# Patient Record
Sex: Female | Born: 1965
Health system: Southern US, Community
[De-identification: ages and names within clinical notes are randomized; demographics above are authoritative.]

## PROBLEM LIST (undated history)

## (undated) DIAGNOSIS — G4730 Sleep apnea, unspecified: Secondary | ICD-10-CM

## (undated) DIAGNOSIS — R519 Headache, unspecified: Secondary | ICD-10-CM

## (undated) DIAGNOSIS — M1990 Unspecified osteoarthritis, unspecified site: Secondary | ICD-10-CM

## (undated) DIAGNOSIS — K219 Gastro-esophageal reflux disease without esophagitis: Secondary | ICD-10-CM

## (undated) DIAGNOSIS — R51 Headache: Secondary | ICD-10-CM

## (undated) HISTORY — PX: UPPER GASTROINTESTINAL ENDOSCOPY: SHX188

## (undated) HISTORY — DX: Gastro-esophageal reflux disease without esophagitis: K21.9

## (undated) HISTORY — DX: Unspecified osteoarthritis, unspecified site: M19.90

## (undated) HISTORY — DX: Headache: R51

## (undated) HISTORY — DX: Headache, unspecified: R51.9

## (undated) HISTORY — DX: Sleep apnea, unspecified: G47.30

---

## 1996-05-06 HISTORY — PX: TONSILLECTOMY AND ADENOIDECTOMY: SUR1326

## 1997-06-28 ENCOUNTER — Inpatient Hospital Stay (HOSPITAL_COMMUNITY): Admission: AD | Admit: 1997-06-28 | Discharge: 1997-07-01 | Payer: Self-pay | Admitting: Obstetrics and Gynecology

## 1998-12-05 ENCOUNTER — Other Ambulatory Visit: Admission: RE | Admit: 1998-12-05 | Discharge: 1998-12-05 | Payer: Self-pay | Admitting: Obstetrics and Gynecology

## 2000-02-18 ENCOUNTER — Other Ambulatory Visit: Admission: RE | Admit: 2000-02-18 | Discharge: 2000-02-18 | Payer: Self-pay | Admitting: Obstetrics & Gynecology

## 2001-03-19 ENCOUNTER — Other Ambulatory Visit: Admission: RE | Admit: 2001-03-19 | Discharge: 2001-03-19 | Payer: Self-pay | Admitting: Obstetrics and Gynecology

## 2001-11-13 ENCOUNTER — Inpatient Hospital Stay (HOSPITAL_COMMUNITY): Admission: AD | Admit: 2001-11-13 | Discharge: 2001-11-14 | Payer: Self-pay | Admitting: Obstetrics and Gynecology

## 2002-05-06 HISTORY — PX: VAGINAL HYSTERECTOMY: SUR661

## 2002-12-23 ENCOUNTER — Other Ambulatory Visit: Admission: RE | Admit: 2002-12-23 | Discharge: 2002-12-23 | Payer: Self-pay | Admitting: Obstetrics and Gynecology

## 2003-05-09 ENCOUNTER — Other Ambulatory Visit: Admission: RE | Admit: 2003-05-09 | Discharge: 2003-05-09 | Payer: Self-pay | Admitting: Obstetrics and Gynecology

## 2004-07-24 ENCOUNTER — Other Ambulatory Visit: Admission: RE | Admit: 2004-07-24 | Discharge: 2004-07-24 | Payer: Self-pay | Admitting: Obstetrics and Gynecology

## 2004-11-13 ENCOUNTER — Inpatient Hospital Stay (HOSPITAL_COMMUNITY): Admission: RE | Admit: 2004-11-13 | Discharge: 2004-11-15 | Payer: Self-pay | Admitting: Obstetrics and Gynecology

## 2013-11-01 DIAGNOSIS — R079 Chest pain, unspecified: Secondary | ICD-10-CM | POA: Insufficient documentation

## 2013-11-08 ENCOUNTER — Other Ambulatory Visit (HOSPITAL_COMMUNITY): Payer: Self-pay | Admitting: Physician Assistant

## 2013-11-08 DIAGNOSIS — R079 Chest pain, unspecified: Secondary | ICD-10-CM

## 2013-11-09 ENCOUNTER — Ambulatory Visit (HOSPITAL_COMMUNITY)
Admission: RE | Admit: 2013-11-09 | Discharge: 2013-11-09 | Disposition: A | Discharge status: Home / Self Care | Payer: BC Managed Care – PPO | Source: Ambulatory Visit | Attending: Internal Medicine | Admitting: Internal Medicine

## 2013-11-09 DIAGNOSIS — R079 Chest pain, unspecified: Secondary | ICD-10-CM | POA: Insufficient documentation

## 2013-11-09 NOTE — Procedures (Signed)
Exercise Treadmill Test Test  Exercise Tolerance Test Ordering MD: Maurice SmallElaine Griffin  I   Unique Test No: 1   Treadmill:  1  Indication for ETT: chest pain - rule out ischemia  Contraindication to ETT: No   Stress Modality: exercise - treadmill  Cardiac Imaging Performed: non   Protocol: standard Bruce - maximal  Max BP:  203/61  Max MPHR (bpm):  173 85% MPR (bpm):  147  MPHR obtained (bpm):  164 % MPHR obtained:  95  Reached 85% MPHR (min:sec):  4:11 Total Exercise Time (min-sec):  7  Workload in METS:  8.5 Borg Scale: 15  Reason ETT Terminated:  dyspnea    ST Segment Analysis At Rest: normal ST segments - no evidence of significant ST depression With Exercise: no evidence of significant ST depression  Other Information Arrhythmia:  No Angina during ETT:  absent (0) Quality of ETT:  non-diagnostic  ETT Interpretation:  borderline (indeterminate) with non-specific ST changes  Comments: Good exercise tolerance Marked hypertensive response to exercise Significant lead artifact (loose leads) at peak stress makes it difficult to exclude ischemic changes Non-diagnostic treadmill stress test, due to lead artifact.  Recommendations: Consider pharmacologic stress test if further concern for ischemic is suspected.  Chrystie NoseKenneth C. Dameon Soltis, MD, [facility] Attending Cardiologist [facility]

## 2013-11-15 ENCOUNTER — Ambulatory Visit (INDEPENDENT_AMBULATORY_CARE_PROVIDER_SITE_OTHER): Payer: BC Managed Care – PPO | Admitting: Internal Medicine

## 2013-11-15 ENCOUNTER — Encounter: Payer: Self-pay | Admitting: Internal Medicine

## 2013-11-15 VITALS — BP 129/76 | HR 67 | Ht 63.0 in | Wt 247.0 lb

## 2013-11-15 DIAGNOSIS — R0789 Other chest pain: Secondary | ICD-10-CM

## 2013-11-15 DIAGNOSIS — R0602 Shortness of breath: Secondary | ICD-10-CM

## 2013-11-15 NOTE — Progress Notes (Addendum)
HPI Patinet says over past 5 to 6 wks.  Works at [employer].  Some days can walk up stairs without problem  SOmetimes  Cant walk up without sB Pain front   Goes to back sometimes.  In back feels like burning. Pain can occur with and without acitvity  Not assocaited with food Cleaned house last weekend  No problem  Felt bad after SUn in church sitting  Started  Whole day didn't feel good Pain different than heart burn Tired  Exhausted    Chol up a little   No Known Allergies  Current Outpatient Prescriptions  Medication Sig Dispense Refill  . Ibuprofen (ADVIL PO) Take by mouth as needed.       No current facility-administered medications for this visit.      PSH:  TAH  Bladder tach  Rectal prolaps repair  T & A   FHx:  Dad CABG 7358  Mom CHF  Diabetic  Moms broth with CAD.  One sis no prblesm   History   Social History  . Marital Status: Married    Spouse Name: N/A    Number of Children: N/A  . Years of Education: N/A   Occupational History  . Not on file.   Social History Main Topics  . Smoking status: Passive Smoke Exposure - Never Smoker  . Smokeless tobacco: Not on file  . Alcohol Use: No  . Drug Use: No  . Sexual Activity: Not on file   Other Topics Concern  . Not on file   Social History Narrative  . No narrative on file   Husband smokes   Review of Systems:  All systems reviewed.  They are negative to the above problem except as previously stated.  Vital Signs: BP 129/76  Pulse 67  Ht 5\' 3"  (1.6 m)  Wt 247 lb (112.038 kg)  BMI 43.76 kg/m2  Physical Exam Patinet is in NAD HEENT:  Normocephalic, atraumatic. EOMI, PERRLA.  Neck: JVP is normal.  No bruits.  Lungs: clear to auscultation. No rales no wheezes.  Heart: Regular rate and rhythm. Normal S1, S2. No S3.   No significant murmurs. PMI not displaced.  Abdomen:  Supple, nontender. Normal bowel sounds. No masses. No hepatomegaly.  Extremities:   Good distal pulses throughout. No lower  extremity edema.  Musculoskeletal :moving all extremities.  Neuro:   alert and oriented x3.  CN II-XII grossly intact.  EKG  SR 67   Assessment and Plan:  1.  SOB  Patient's are somewhat atypical  I would set up for echo to evaluate systolic and diastolic function.   Continue activies as tolerated.    2.  HCM  Will set up for fasting labs esp given family history of CAD

## 2013-11-15 NOTE — Patient Instructions (Signed)
Your physician has requested that you have an echocardiogram. Echocardiography is a painless test that uses sound waves to create images of your heart. It provides your doctor with information about the size and shape of your heart and how well your heart's chambers and valves are working. This procedure takes approximately one hour. There are no restrictions for this procedure.  WHEN YOU RETURN FOR ECHO WILL HAVE YOU GET FASTING LABS (LP/TSH/CBC)

## 2013-11-25 ENCOUNTER — Other Ambulatory Visit (INDEPENDENT_AMBULATORY_CARE_PROVIDER_SITE_OTHER): Payer: BC Managed Care – PPO

## 2013-11-25 ENCOUNTER — Ambulatory Visit (HOSPITAL_COMMUNITY): Payer: BC Managed Care – PPO | Attending: Cardiology | Admitting: Radiology

## 2013-11-25 ENCOUNTER — Other Ambulatory Visit (HOSPITAL_COMMUNITY): Payer: BC Managed Care – PPO | Admitting: Cardiology

## 2013-11-25 DIAGNOSIS — I517 Cardiomegaly: Secondary | ICD-10-CM | POA: Insufficient documentation

## 2013-11-25 DIAGNOSIS — R0789 Other chest pain: Secondary | ICD-10-CM

## 2013-11-25 DIAGNOSIS — R079 Chest pain, unspecified: Secondary | ICD-10-CM

## 2013-11-25 DIAGNOSIS — R0602 Shortness of breath: Secondary | ICD-10-CM

## 2013-11-25 DIAGNOSIS — E669 Obesity, unspecified: Secondary | ICD-10-CM | POA: Insufficient documentation

## 2013-11-25 DIAGNOSIS — Z8249 Family history of ischemic heart disease and other diseases of the circulatory system: Secondary | ICD-10-CM | POA: Insufficient documentation

## 2013-11-25 DIAGNOSIS — I059 Rheumatic mitral valve disease, unspecified: Secondary | ICD-10-CM | POA: Insufficient documentation

## 2013-11-25 LAB — CBC WITH DIFFERENTIAL/PLATELET
BASOS ABS: 0 10*3/uL (ref 0.0–0.1)
Basophils Relative: 0.3 % (ref 0.0–3.0)
EOS ABS: 0.1 10*3/uL (ref 0.0–0.7)
Eosinophils Relative: 1.3 % (ref 0.0–5.0)
HEMATOCRIT: 39.1 % (ref 36.0–46.0)
Hemoglobin: 13.1 g/dL (ref 12.0–15.0)
Lymphocytes Relative: 26.2 % (ref 12.0–46.0)
Lymphs Abs: 2 10*3/uL (ref 0.7–4.0)
MCHC: 33.6 g/dL (ref 30.0–36.0)
MCV: 86.4 fl (ref 78.0–100.0)
MONO ABS: 0.4 10*3/uL (ref 0.1–1.0)
Monocytes Relative: 5.5 % (ref 3.0–12.0)
NEUTROS ABS: 5.2 10*3/uL (ref 1.4–7.7)
Neutrophils Relative %: 66.7 % (ref 43.0–77.0)
Platelets: 233 10*3/uL (ref 150.0–400.0)
RBC: 4.52 Mil/uL (ref 3.87–5.11)
RDW: 12.8 % (ref 11.5–15.5)
WBC: 7.8 10*3/uL (ref 4.0–10.5)

## 2013-11-25 LAB — LIPID PANEL
CHOLESTEROL: 175 mg/dL (ref 0–200)
HDL: 38.4 mg/dL — AB (ref >39.00)
LDL Cholesterol: 112 mg/dL — ABNORMAL HIGH (ref 0–99)
NonHDL: 136.6
Total CHOL/HDL Ratio: 5
Triglycerides: 121 mg/dL (ref 0.0–149.0)
VLDL: 24.2 mg/dL (ref 0.0–40.0)

## 2013-11-25 LAB — TSH: TSH: 1.24 u[IU]/mL (ref 0.35–4.50)

## 2013-11-25 MED ORDER — PERFLUTREN PROTEIN A MICROSPH IV SUSP
3.0000 mL | Freq: Once | INTRAVENOUS | Status: AC
  Administered 2013-11-25: 3 mL via INTRAVENOUS

## 2013-11-25 NOTE — Progress Notes (Signed)
Echocardiogram performed with [employer].  

## 2013-11-28 ENCOUNTER — Telehealth: Payer: Self-pay | Admitting: Internal Medicine

## 2013-12-07 NOTE — Telephone Encounter (Signed)
error 

## 2013-12-28 ENCOUNTER — Institutional Professional Consult (permissible substitution): Payer: BC Managed Care – PPO | Admitting: Cardiology

## 2014-08-08 ENCOUNTER — Ambulatory Visit: Payer: Self-pay | Admitting: Gastroenterology

## 2014-09-27 ENCOUNTER — Ambulatory Visit (INDEPENDENT_AMBULATORY_CARE_PROVIDER_SITE_OTHER): Payer: 59 | Admitting: Gastroenterology

## 2014-09-27 ENCOUNTER — Encounter: Payer: Self-pay | Admitting: Gastroenterology

## 2014-09-27 ENCOUNTER — Other Ambulatory Visit (INDEPENDENT_AMBULATORY_CARE_PROVIDER_SITE_OTHER): Payer: 59

## 2014-09-27 VITALS — BP 136/84 | HR 92 | Ht 62.75 in | Wt 244.5 lb

## 2014-09-27 DIAGNOSIS — R1011 Right upper quadrant pain: Secondary | ICD-10-CM | POA: Diagnosis not present

## 2014-09-27 DIAGNOSIS — K219 Gastro-esophageal reflux disease without esophagitis: Secondary | ICD-10-CM

## 2014-09-27 DIAGNOSIS — R079 Chest pain, unspecified: Secondary | ICD-10-CM

## 2014-09-27 LAB — BASIC METABOLIC PANEL
BUN: 11 mg/dL (ref 6–23)
CHLORIDE: 104 meq/L (ref 96–112)
CO2: 29 meq/L (ref 19–32)
CREATININE: 0.71 mg/dL (ref 0.40–1.20)
Calcium: 9.2 mg/dL (ref 8.4–10.5)
GFR: 93.33 mL/min (ref >60.00)
GLUCOSE: 99 mg/dL (ref 70–99)
POTASSIUM: 3.9 meq/L (ref 3.5–5.1)
SODIUM: 138 meq/L (ref 135–145)

## 2014-09-27 LAB — CBC WITH DIFFERENTIAL/PLATELET
BASOS PCT: 0.4 % (ref 0.0–3.0)
Basophils Absolute: 0 10*3/uL (ref 0.0–0.1)
EOS ABS: 0.1 10*3/uL (ref 0.0–0.7)
Eosinophils Relative: 1.7 % (ref 0.0–5.0)
HCT: 38.4 % (ref 36.0–46.0)
HEMOGLOBIN: 12.9 g/dL (ref 12.0–15.0)
Lymphocytes Relative: 29.1 % (ref 12.0–46.0)
Lymphs Abs: 2.1 10*3/uL (ref 0.7–4.0)
MCHC: 33.6 g/dL (ref 30.0–36.0)
MCV: 86 fl (ref 78.0–100.0)
MONO ABS: 0.5 10*3/uL (ref 0.1–1.0)
Monocytes Relative: 6.6 % (ref 3.0–12.0)
NEUTROS PCT: 62.2 % (ref 43.0–77.0)
Neutro Abs: 4.6 10*3/uL (ref 1.4–7.7)
Platelets: 244 10*3/uL (ref 150.0–400.0)
RBC: 4.47 Mil/uL (ref 3.87–5.11)
RDW: 13.6 % (ref 11.5–15.5)
WBC: 7.4 10*3/uL (ref 4.0–10.5)

## 2014-09-27 LAB — HEPATIC FUNCTION PANEL
ALBUMIN: 4.2 g/dL (ref 3.5–5.2)
ALK PHOS: 76 U/L (ref 39–117)
ALT: 15 U/L (ref 0–35)
AST: 12 U/L (ref 0–37)
BILIRUBIN DIRECT: 0.1 mg/dL (ref 0.0–0.3)
BILIRUBIN TOTAL: 0.6 mg/dL (ref 0.2–1.2)
TOTAL PROTEIN: 7.3 g/dL (ref 6.0–8.3)

## 2014-09-27 LAB — TSH: TSH: 1.48 u[IU]/mL (ref 0.35–4.50)

## 2014-09-27 MED ORDER — OMEPRAZOLE 40 MG PO CPDR: 40.0000 mg | DELAYED_RELEASE_CAPSULE | Freq: Every day | ORAL | Status: DC

## 2014-09-27 NOTE — Progress Notes (Signed)
    History of Present Illness: This is a 49 year old female presenting for the evaluation of intermittent right upper quadrant pain, right lower chest pain, right back pain and mid substernal pain. She underwent evaluation by cardiology one year ago for these symptoms. Echocardiogram and treadmill test were negative. She states she takes over-the-counter Prilosec on occasion with some help and her symptoms. Her symptoms are episodic and occasionally brought on by meals. She notes occasionally her stools change from Beckers to yellow on occasion but the frequency of stools has not changed. Denies weight loss, constipation, diarrhea, change in stool caliber, melena, hematochezia, nausea, vomiting, dysphagia.  Review of Systems: Pertinent positive and negative review of systems were noted in the above HPI section. All other review of systems were otherwise negative.  Current Medications, Allergies, Past Medical History, Past Surgical History, Family History and Social History were reviewed in [facility] electronic [facility] record.  Physical Exam: General: Well developed, well nourished, obese, no acute distress Head: Normocephalic and atraumatic Eyes:  sclerae anicteric, EOMI Ears: Normal auditory acuity Mouth: No deformity or lesions Neck: Supple, no masses or thyromegaly Lungs: Clear throughout to auscultation, no chest wall tenderness Heart: Regular rate and rhythm; no murmurs, rubs or bruits Abdomen: Soft, non tender and non distended. No masses, hepatosplenomegaly or hernias noted. Normal Bowel sounds Musculoskeletal: Symmetrical with no gross deformities  Skin: No lesions on visible extremities Pulses:  Normal pulses noted Extremities: No clubbing, cyanosis, edema or deformities noted Neurological: Alert oriented x 4, grossly nonfocal Cervical Nodes:  No significant cervical adenopathy Inguinal Nodes: No significant inguinal adenopathy Psychological:  Alert and cooperative. Normal mood  and affect  Assessment and Recommendations:  1. Right upper quadrant pain, right chest pain, right back pain, midsternal chest pain. Rule out GERD, ulcer, gastritis, cholelithiasis, musculoskeletal etc. Begin pantoprazole 40 mg daily and standard antireflux measures. Obtain standard blood tests, schedule abdominal ultrasound and schedule upper endoscopy. The risks (including bleeding, perforation, infection, missed lesions, medication reactions and possible hospitalization or surgery if complications occur), benefits, and alternatives to endoscopy with possible biopsy and possible dilation were discussed with the patient and they consent to proceed.   2. Colorectal cancer screening, average risk. Recommend colonoscopy at age 49.

## 2014-09-27 NOTE — Patient Instructions (Addendum)
Your physician has requested that you go to the basement for the following lab work before leaving today:[facility] panel.  We have sent the following medications to your pharmacy for you to pick up at your convenience:omeprazole.  Patient advised to avoid spicy, acidic, citrus, chocolate, mints, fruit and fruit juices.  Limit the intake of caffeine, alcohol and Soda.  Don't exercise too soon after eating.  Don't lie down within 3-4 hours of eating.  Elevate the head of your bed.  You have been scheduled for an endoscopy. Please follow written instructions given to you at your visit today. If you use inhalers (even only as needed), please bring them with you on the day of your procedure. Your physician has requested that you go to www.startemmi.com and enter the access code given to you at your visit today. This web site gives a general overview about your procedure. However, you should still follow specific instructions given to you by our office regarding your preparation for the procedure.  You have been scheduled [facility] an abdominal [facility] (1st floor of hospital) on 09/28/14 at 8:30am. Please arrive 15 minutes prior to your appointment for registration. Make certain not to have anything to eat or drink 6 hours prior to your appointment. Should you need to reschedule your appointment, please contact radiology at [phone]. This test typically takes about 30 minutes to perform.  Thank you for choosing me and [facility].  Venita LickMalcolm T. Pleas KochStark, Jr., MD., Clementeen GrahamFACG  cc: Maurice SmallElaine Griffin, MD  .gib

## 2014-09-28 ENCOUNTER — Ambulatory Visit (HOSPITAL_COMMUNITY)
Admission: RE | Admit: 2014-09-28 | Discharge: 2014-09-28 | Disposition: A | Discharge status: Home / Self Care | Payer: 59 | Source: Ambulatory Visit | Attending: Gastroenterology | Admitting: Gastroenterology

## 2014-09-28 DIAGNOSIS — R079 Chest pain, unspecified: Secondary | ICD-10-CM

## 2014-09-28 DIAGNOSIS — R1011 Right upper quadrant pain: Secondary | ICD-10-CM | POA: Diagnosis not present

## 2014-09-28 DIAGNOSIS — K824 Cholesterolosis of gallbladder: Secondary | ICD-10-CM | POA: Diagnosis not present

## 2014-10-04 ENCOUNTER — Ambulatory Visit (AMBULATORY_SURGERY_CENTER): Payer: 59 | Admitting: Gastroenterology

## 2014-10-04 ENCOUNTER — Encounter: Payer: Self-pay | Admitting: Gastroenterology

## 2014-10-04 VITALS — BP 141/84 | HR 75 | Temp 97.5°F | Resp 16 | Ht 62.75 in | Wt 244.0 lb

## 2014-10-04 DIAGNOSIS — R1011 Right upper quadrant pain: Secondary | ICD-10-CM | POA: Diagnosis not present

## 2014-10-04 DIAGNOSIS — R0789 Other chest pain: Secondary | ICD-10-CM | POA: Diagnosis not present

## 2014-10-04 DIAGNOSIS — K2210 Ulcer of esophagus without bleeding: Secondary | ICD-10-CM

## 2014-10-04 DIAGNOSIS — K219 Gastro-esophageal reflux disease without esophagitis: Secondary | ICD-10-CM | POA: Diagnosis not present

## 2014-10-04 DIAGNOSIS — K2950 Unspecified chronic gastritis without bleeding: Secondary | ICD-10-CM | POA: Diagnosis not present

## 2014-10-04 MED ORDER — SODIUM CHLORIDE 0.9 % IV SOLN: 500.0000 mL | INTRAVENOUS | Status: DC

## 2014-10-04 NOTE — Progress Notes (Signed)
Called to room to assist during endoscopic procedure.  Patient ID and intended procedure confirmed with present staff. Received instructions for my participation in the procedure from the performing physician.  

## 2014-10-04 NOTE — Progress Notes (Signed)
Report to PACU, RN, vss, BBS= Clear.  

## 2014-10-04 NOTE — Patient Instructions (Signed)
YOU HAD AN ENDOSCOPIC PROCEDURE TODAY AT THE [facility]:   Refer to the procedure report that was given to you for any specific questions about what was found during the examination.  If the procedure report does not answer your questions, please call your gastroenterologist to clarify.  If you requested that your care partner not be given the details of your procedure findings, then the procedure report has been included in a sealed envelope for you to review at your convenience later.  YOU SHOULD EXPECT: Some feelings of bloating in the abdomen. Passage of more gas than usual.  Walking can help get rid of the air that was put into your GI tract during the procedure and reduce the bloating. If you had a lower endoscopy (such as a colonoscopy or flexible sigmoidoscopy) you may notice spotting of blood in your stool or on the toilet paper. If you underwent a bowel prep for your procedure, you may not have a normal bowel movement for a few days.  Please Note:  You might notice some irritation and congestion in your nose or some drainage.  This is from the oxygen used during your procedure.  There is no need for concern and it should clear up in a day or so.  SYMPTOMS TO REPORT IMMEDIATELY:    Following upper endoscopy (EGD)  Vomiting of blood or coffee ground material  New chest pain or pain under the shoulder blades  Painful or persistently difficult swallowing  New shortness of breath  Fever of 100F or higher  Black, tarry-looking stools  For urgent or emergent issues, a gastroenterologist can be reached at any hour by calling [phone].   DIET: Your first meal following the procedure should be a small meal and then it is ok to progress to your normal diet. Heavy or fried foods are harder to digest and may make you feel nauseous or bloated.  Likewise, meals heavy in dairy and vegetables can increase bloating.  Drink plenty of fluids but you should avoid alcoholic beverages  for 24 hours.  ACTIVITY:  You should plan to take it easy for the rest of today and you should NOT DRIVE or use heavy machinery until tomorrow (because of the sedation medicines used during the test).    FOLLOW UP: Our staff will call the number listed on your records the next business day following your procedure to check on you and address any questions or concerns that you may have regarding the information given to you following your procedure. If we do not reach you, we will leave a message.  However, if you are feeling well and you are not experiencing any problems, there is no need to return our call.  We will assume that you have returned to your regular daily activities without incident.  If any biopsies were taken you will be contacted by phone or by letter within the next 1-3 weeks.  Please call us at [phone] if you have not heard about the biopsies in 3 weeks.    SIGNATURES/CONFIDENTIALITY: You and/or your care partner have signed paperwork which will be entered into your electronic medical record.  These signatures attest to the fact that that the information above on your After Visit Summary has been reviewed and is understood.  Full responsibility of the confidentiality of this discharge information lies with you and/or your care-partner.  Esophagitis, anti-reflux regimen, and hiatal hernia information given.  Follow-up in office 6-8 weeks.  Continue your meds.

## 2014-10-04 NOTE — Op Note (Signed)
[facility] [address] [place], [id number]   ENDOSCOPY PROCEDURE REPORT  PATIENT: [name], Tracy T  MR#: [id number] BIRTHDATE: April 08, 1966 , 49  yrs. old GENDER: female ENDOSCOPIST: Meryl DareMalcolm T Krisalyn Yankowski, MD, [facility] REFERRED BY:  Maurice SmallElaine Griffin, M.D. PROCEDURE DATE:  10/04/2014 PROCEDURE:  EGD w/ biopsy ASA CLASS:     Class III INDICATIONS:  chest pain, abdominal pain in the upper right quadrant, and history of esophageal reflux. MEDICATIONS: Monitored anesthesia care and Propofol 200 mg IV TOPICAL ANESTHETIC: none DESCRIPTION OF PROCEDURE: After the risks benefits and alternatives of the procedure were thoroughly explained, informed consent was obtained.  The LB [id number] endoscope was introduced through the mouth and advanced to the second portion of the duodenum , Without limitations.  The instrument was slowly withdrawn as the mucosa was fully examined.    ESOPHAGUS: There was LA Class B esophagitis (One or more mucosal breaks > 5mm, but without continuity across mucosal folds) noted. There was short segment erythema found in the distal esophagus, distal to her erosions.  The length was 1-2cm . R/O Barrett's. Multiple biopsies were performed.   The esophagus was otherwise normal. STOMACH: The mucosa and folds of the stomach appeared normal. DUODENUM: The duodenal mucosa showed no abnormalities in the bulb and 2nd part of the duodenum.  Retroflexed views revealed a small hiatal hernia.     The scope was then withdrawn from the patient and the procedure completed.  COMPLICATIONS: There were no immediate complications.  ENDOSCOPIC IMPRESSION: 1.   LA Class B esophagitis 2.   Erythema found in the distal esophagus; multiple biopsies performed 3.   Small hiatal hernia  RECOMMENDATIONS: 1.  Anti-reflux regimen long term 2.  Await pathology results 3.  Continue PPI qam long term 4.  OP follow-up in 6-8 weeks  [R eSigned:  Meryl DareMalcolm T [place] Katich, MD, [facility]  10/04/2014 3:36 PM  [C

## 2014-10-05 ENCOUNTER — Telehealth: Payer: Self-pay | Admitting: *Deleted

## 2014-10-05 NOTE — Telephone Encounter (Signed)
  Follow up Call-  Call back number 10/04/2014  Post procedure Call Back phone  # [phone]  Permission to leave phone message Yes     Patient questions:  Do you have a fever, pain , or abdominal swelling? No. Pain Score  0 *  Have you tolerated food without any problems? Yes.    Have you been able to return to your normal activities? Yes.    Do you have any questions about your discharge instructions: Diet   No. Medications  No. Follow up visit  No.  Do you have questions or concerns about your Care? No.  Actions: * If pain score is 4 or above: No action needed, pain <4.  Patient c/o "sore throat", denies any other problems. Explained she may use OTC products if needed. Encouraged patient to call us back if needed.

## 2014-10-14 ENCOUNTER — Encounter: Payer: Self-pay | Admitting: Gastroenterology

## 2015-01-05 ENCOUNTER — Ambulatory Visit: Payer: 59 | Admitting: Gastroenterology

## 2015-01-26 ENCOUNTER — Encounter: Payer: Self-pay | Admitting: Gastroenterology

## 2015-01-26 ENCOUNTER — Ambulatory Visit (INDEPENDENT_AMBULATORY_CARE_PROVIDER_SITE_OTHER): Payer: Commercial Managed Care - HMO | Admitting: Gastroenterology

## 2015-01-26 VITALS — BP 130/78 | HR 88 | Ht 62.75 in | Wt 249.6 lb

## 2015-01-26 DIAGNOSIS — K2100 Gastro-esophageal reflux disease with esophagitis, without bleeding: Secondary | ICD-10-CM

## 2015-01-26 DIAGNOSIS — R079 Chest pain, unspecified: Secondary | ICD-10-CM

## 2015-01-26 MED ORDER — OMEPRAZOLE 40 MG PO CPDR: 40.0000 mg | DELAYED_RELEASE_CAPSULE | Freq: Two times a day (BID) | ORAL | Status: DC

## 2015-01-26 NOTE — Progress Notes (Signed)
    History of Present Illness: This is a 49 year old female returning for follow-up of GERD. EGD in May 2016 showed LA class B erosive esophagitis and a small hiatal hernia. Abdominal ultrasound showed small gallbladder polyps. Her symptoms are under better control but still has symptoms about every other day. No dysphagia, N/V.   Current Medications, Allergies, Past Medical History, Past Surgical History, Family History and Social History were reviewed in [employer] record.  Physical Exam: General: Well developed, well nourished, obese, no acute distress Head: Normocephalic and atraumatic Eyes:  sclerae anicteric, EOMI Ears: Normal auditory acuity Mouth: No deformity or lesions Lungs: Clear throughout to auscultation Heart: Regular rate and rhythm; no murmurs, rubs or bruits Abdomen: Soft, non tender and non distended. No masses, hepatosplenomegaly or hernias noted. Normal Bowel sounds Musculoskeletal: Symmetrical with no gross deformities  Pulses:  Normal pulses noted Extremities: No clubbing, cyanosis, edema or deformities noted Neurological: Alert oriented x 4, grossly nonfocal Psychological:  Alert and cooperative. Normal mood and affect  Assessment and Recommendations:  1. GERD with LA class B erosive esophagitis. Symptoms not adequately controlled. Increase omeprazole to 40 mg po bid. Weight loss program. Call us with progress report in 2 months.

## 2015-01-26 NOTE — Patient Instructions (Signed)
We have sent the following medications to your pharmacy for you to pick up at your convenience: Omeprazole 40 mg twice a day   Please call the office in 2 months with an update on your symptoms.  Follow up in one year.  Thank you for choosing me and [facility].  Venita Lick. Pleas Koch., MD., Clementeen Graham

## 2016-06-17 ENCOUNTER — Encounter: Payer: Self-pay | Admitting: Gastroenterology

## 2016-07-09 IMAGING — US US ABDOMEN COMPLETE
1 series · 14 of 25 positions shown · non-contrast
Comparison: None.

CLINICAL DATA: Right upper quadrant pain for 1 year.

EXAM:
ULTRASOUND ABDOMEN COMPLETE

[Series 1: us abdomen complete · 0.21mm/px · 14 of 96 slices shown]
[im 1/96]
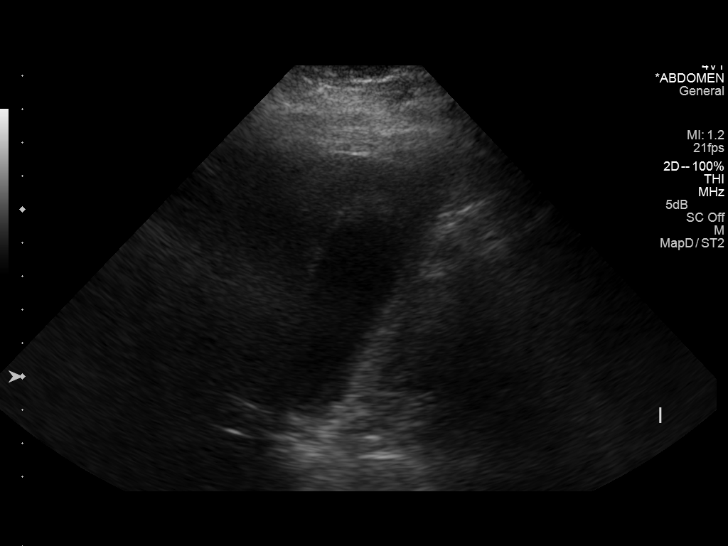
[im 8/96]
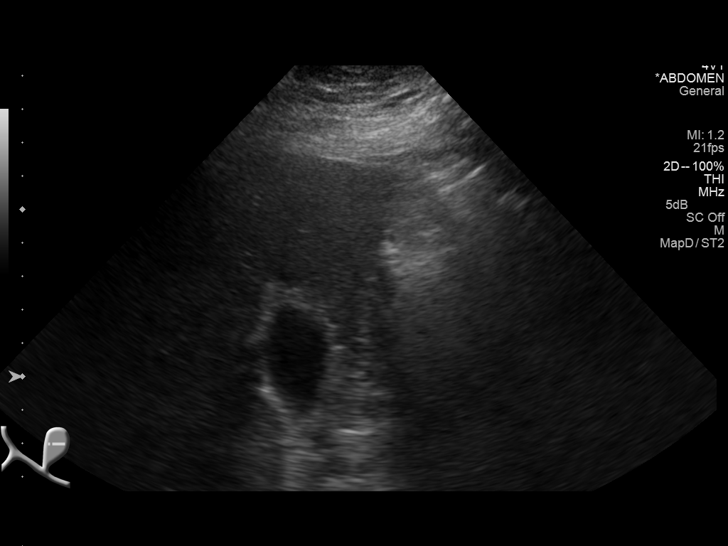
[im 16/96]
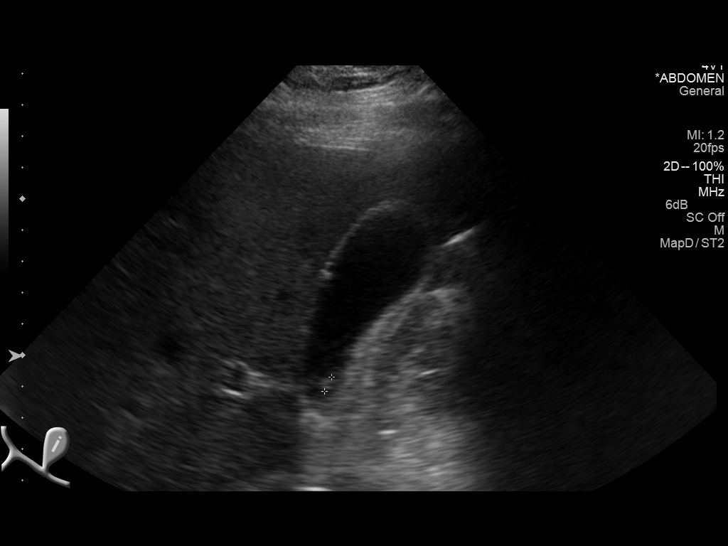
[im 24/96]
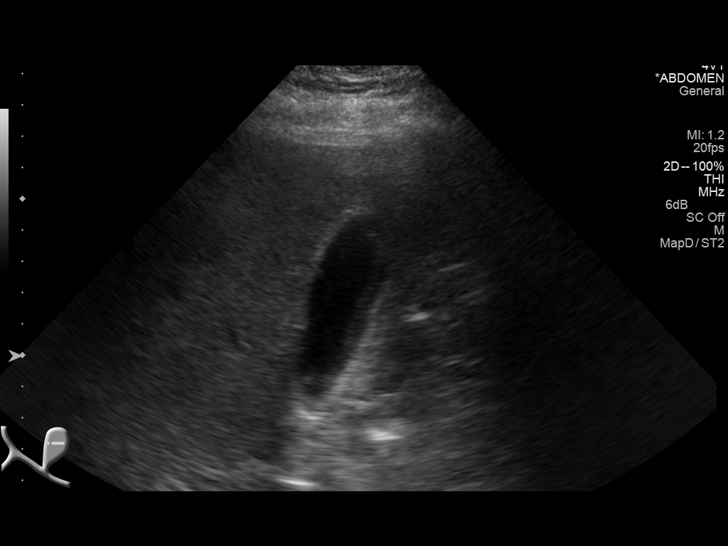
[im 32/96]
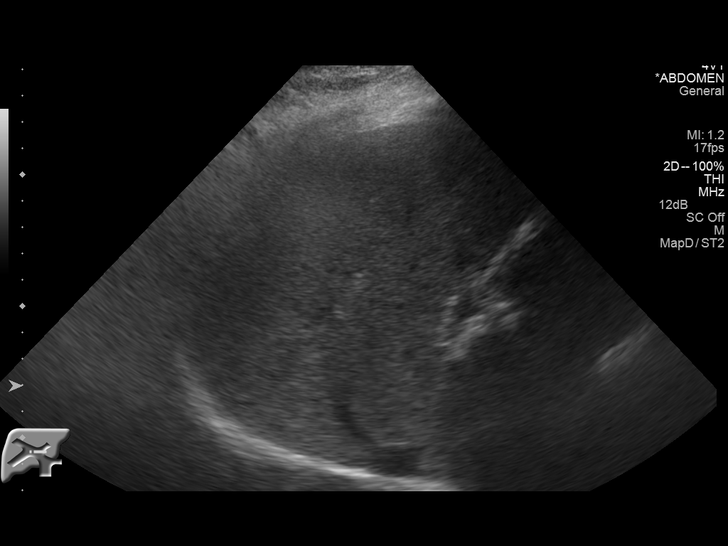
[im 36/96]
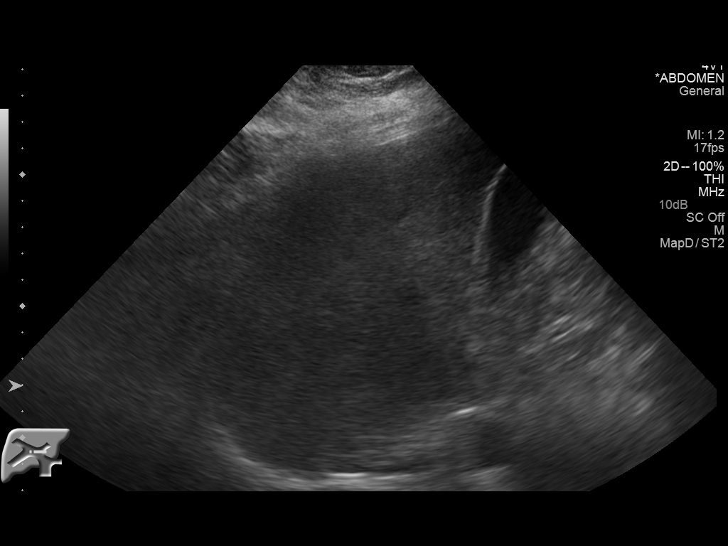
[im 44/96]
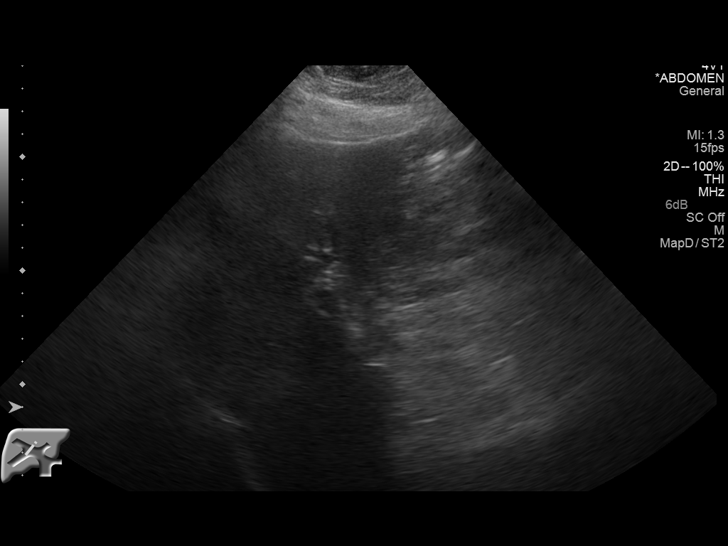
[im 52/96]
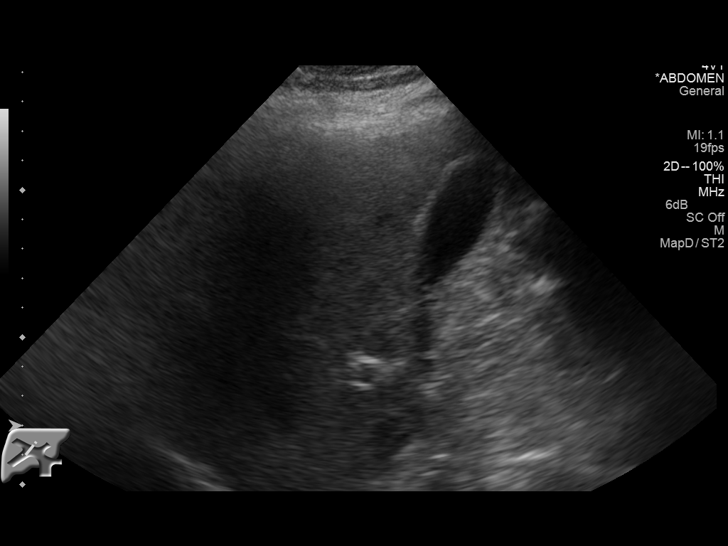
[im 60/96]
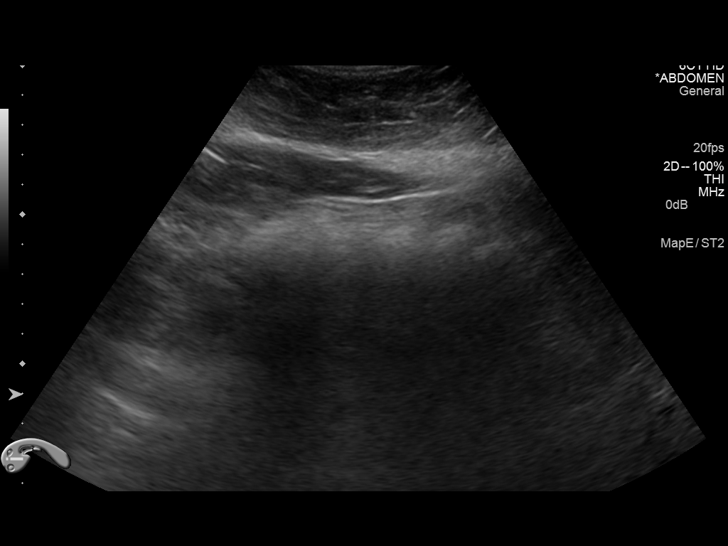
[im 64/96]
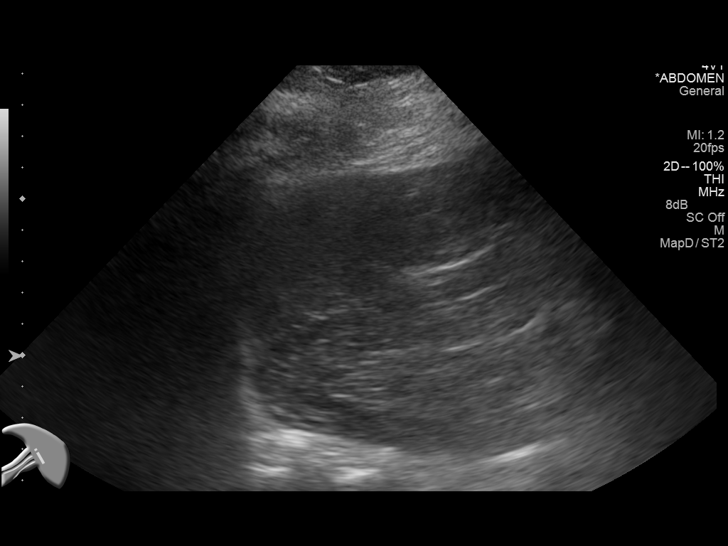
[im 72/96]
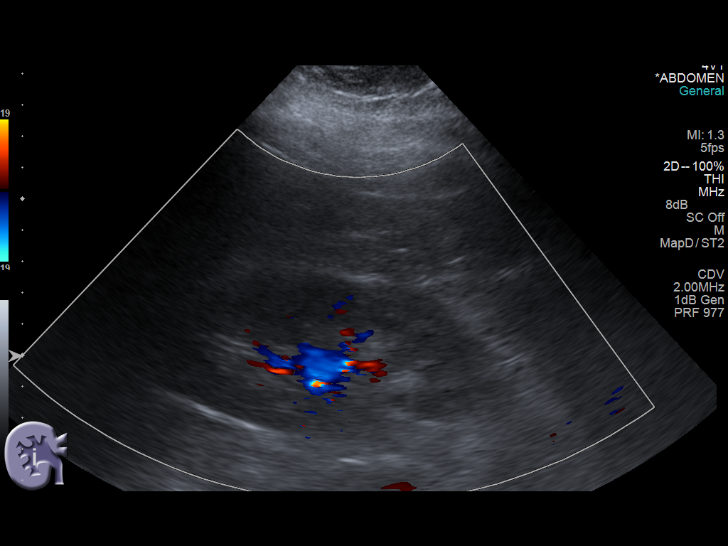
[im 80/96]
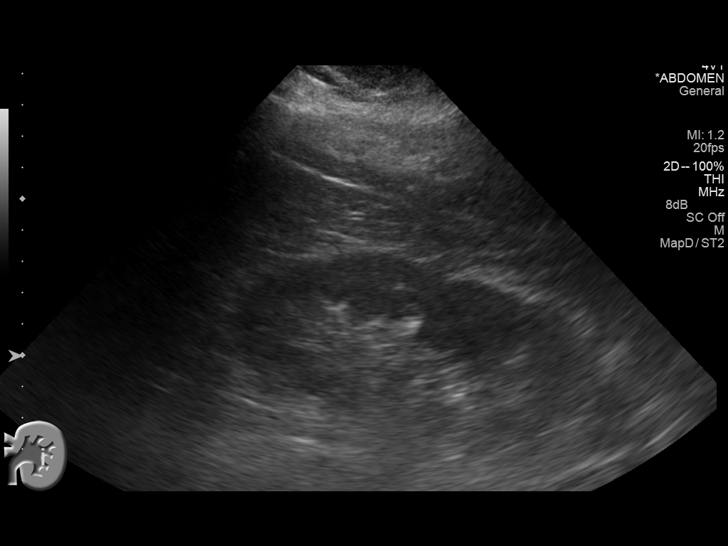
[im 88/96]
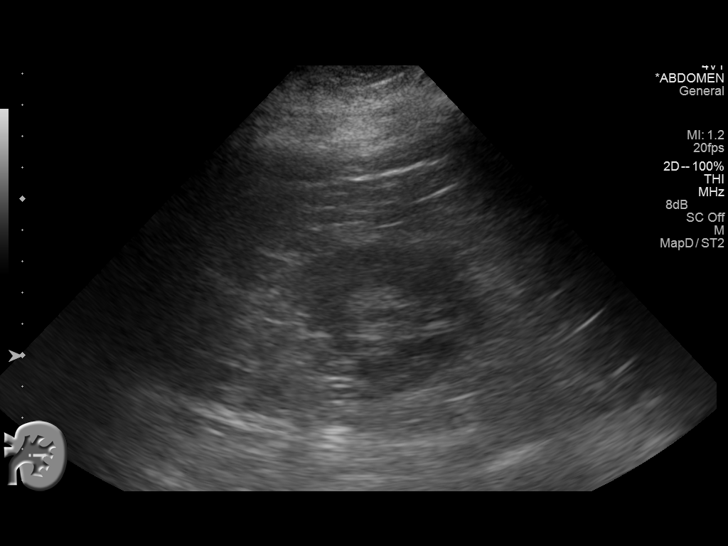
[im 96/96]
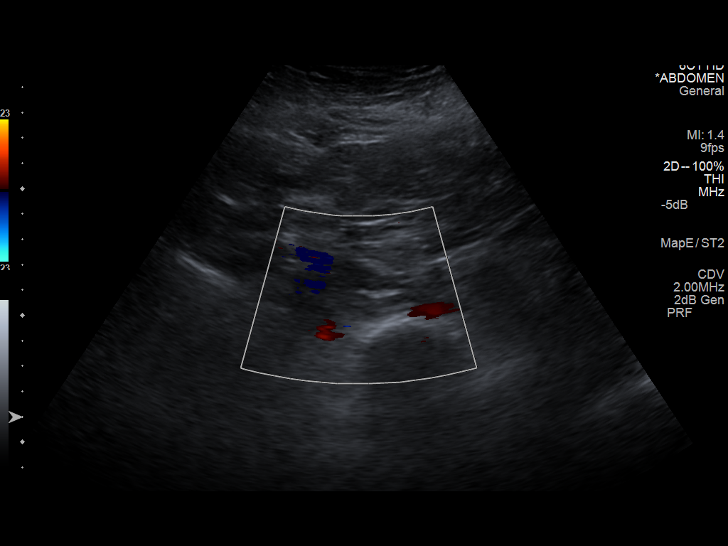

[14 of 25 positions shown; findings below may reference images not displayed]

FINDINGS: Gallbladder: 2 small polyps within the gallbladder neck, measuring 5
mm. No gallstones. No wall thickening. Negative sonographic
Klpigbb.

Common bile duct: Diameter: Normal caliber, 3 mm.

Liver: No focal lesion identified. Within normal limits in
parenchymal echogenicity.

IVC: No abnormality visualized.

Pancreas: Visualized portion unremarkable.

Spleen: Size and appearance within normal limits.

Right Kidney: Length: 11.9 cm. Echogenicity within normal limits. No
mass or hydronephrosis visualized.

Left Kidney: Length: 10.9 cm. Echogenicity within normal limits. No
mass or hydronephrosis visualized.

Abdominal aorta: No aneurysm visualized.

Other findings: None.
IMPRESSION: Small gallbladder wall polyps as above. No cholelithiasis or acute
cholecystitis.

## 2017-07-25 DIAGNOSIS — H2513 Age-related nuclear cataract, bilateral: Secondary | ICD-10-CM | POA: Diagnosis not present

## 2017-07-29 DIAGNOSIS — S93529A Sprain of metatarsophalangeal joint of unspecified toe(s), initial encounter: Secondary | ICD-10-CM | POA: Diagnosis not present

## 2017-09-11 DIAGNOSIS — M25572 Pain in left ankle and joints of left foot: Secondary | ICD-10-CM | POA: Diagnosis not present

## 2017-11-04 DIAGNOSIS — M2011 Hallux valgus (acquired), right foot: Secondary | ICD-10-CM | POA: Diagnosis not present

## 2018-01-08 DIAGNOSIS — S60212A Contusion of left wrist, initial encounter: Secondary | ICD-10-CM | POA: Diagnosis not present

## 2018-05-26 ENCOUNTER — Encounter: Payer: Self-pay | Admitting: Emergency Medicine

## 2018-05-26 ENCOUNTER — Ambulatory Visit
Admission: EM | Admit: 2018-05-26 | Discharge: 2018-05-26 | Disposition: A | Discharge status: Home / Self Care | Payer: 59 | Attending: Emergency Medicine | Admitting: Emergency Medicine

## 2018-05-26 ENCOUNTER — Ambulatory Visit: Payer: 59

## 2018-05-26 DIAGNOSIS — S61451A Open bite of right hand, initial encounter: Secondary | ICD-10-CM | POA: Diagnosis not present

## 2018-05-26 DIAGNOSIS — W540XXA Bitten by dog, initial encounter: Secondary | ICD-10-CM | POA: Diagnosis not present

## 2018-05-26 DIAGNOSIS — S61259A Open bite of unspecified finger without damage to nail, initial encounter: Secondary | ICD-10-CM

## 2018-05-26 MED ORDER — AMOXICILLIN-POT CLAVULANATE 875-125 MG PO TABS: 1.0000 | ORAL_TABLET | Freq: Two times a day (BID) | ORAL | 0 refills | Status: AC

## 2018-05-26 MED ORDER — CEFTRIAXONE SODIUM 1 G IJ SOLR
1.0000 g | Freq: Once | INTRAMUSCULAR | Status: AC
  Administered 2018-05-26: 1 g via INTRAMUSCULAR

## 2018-05-26 MED ORDER — IBUPROFEN 600 MG PO TABS: 600.0000 mg | ORAL_TABLET | Freq: Four times a day (QID) | ORAL | 0 refills | Status: AC | PRN

## 2018-05-26 NOTE — Discharge Instructions (Addendum)
600 mg of ibuprofen combined with 1 g of Tylenol together 3 or 4 times a day as needed for pain.  Finish the Augmentin, even if you feel better.  Follow-up with Dr. Amanda Pea or PA Tinnie Gens in 3 days if you are not getting any better, go to the ER for the signs and symptoms we discussed.

## 2018-05-26 NOTE — ED Triage Notes (Signed)
Pt presents to [facility] for assessment of dog bite to right hand yesterday morning.   Multiple puncture wounds noted.  Pt's personal dog, states up to date on rabies.

## 2018-05-26 NOTE — ED Provider Notes (Signed)
HPI  SUBJECTIVE:  Tracy Sellers is a right-handed 53 y.o. female who presents with multiple puncture wounds to her right fingers after being bitten on the hand by her daughter's dog at home yesterday. she thinks that the dog was protecting its crate.  She states that the dog's immunizations are up-to-date.  She reports intermittent burning soreness that is present with movement only in her fingers.  She reports swelling along her fingers and erythema at the index finger.  She reports serosanguineous drainage from the distal puncture wound on the index finger.  She reports limitation of motion of the index finger secondary to pain and edema.  No numbness, tingling, fevers, body aches.  She tried Advil 800 mg without relief.  Symptoms are better with rest, worse with moving her fingers.  She took Advil within 6 hours of evaluation.  Her tetanus was updated last year.  Past medical history negative for diabetes, smoking.  PMD: Dr. Valentina LucksGriffin.      Past Medical History:  Diagnosis Date  . GERD (gastroesophageal reflux disease)   . Headache     Past Surgical History:  Procedure Laterality Date  . TONSILLECTOMY AND ADENOIDECTOMY    . VAGINAL HYSTERECTOMY  2004   with bladder tact and rectal prolapse repair    Family History  Problem Relation Age of Onset  . CAD Mother   . Diabetes Mother   . Heart failure Mother        CHF  . Kidney disease Mother   . Heart disease Father        MI  . Colon polyps Father   . Breast cancer Paternal Grandmother   . Prostate cancer Paternal Uncle        great uncle  . Diabetes Maternal Grandfather   . Stroke Maternal Grandfather   . Diabetes Maternal Grandmother   . Heart failure Maternal Grandmother        CHF  . Diabetes Maternal Uncle   . Diabetes Maternal Aunt   . Lung cancer Paternal Grandfather   . Leukemia Maternal Uncle        Hairy Cell    Social History   Tobacco Use  . Smoking status: Passive Smoke Exposure - Never Smoker  .  Smokeless tobacco: Never Used  Substance Use Topics  . Alcohol use: No    Alcohol/week: 0.0 standard drinks  . Drug use: No    No current facility-administered medications for this encounter.   Current Outpatient Medications:  .  omeprazole (PRILOSEC) 40 MG capsule, Take 1 capsule (40 mg total) by mouth 2 (two) times daily., Disp: 60 capsule, Rfl: 11 .  amoxicillin-clavulanate (AUGMENTIN) 875-125 MG tablet, Take 1 tablet by mouth 2 (two) times daily for 10 days., Disp: 20 tablet, Rfl: 0 .  ibuprofen (ADVIL,MOTRIN) 600 MG tablet, Take 1 tablet (600 mg total) by mouth every 6 (six) hours as needed., Disp: 20 tablet, Rfl: 0  No Known Allergies   ROS  As noted in HPI.   Physical Exam  BP (!) 152/89 (BP Location: Right Arm)   Pulse 66   Temp (!) 97.5 F (36.4 C) (Oral)   Resp 18   SpO2 98%   Constitutional: Well developed, well nourished, no acute distress Eyes:  EOMI, conjunctiva normal bilaterally HENT: Normocephalic, atraumatic,mucus membranes moist Respiratory: Normal inspiratory effort Cardiovascular: Normal rate GI: nondistended Skin: Multiple puncture wounds on right fingers except thumb.  See exam below. Musculoskeletal: no deformities.  Positive swelling through index, middle, ring,  little finger.  erythema along the index finger.  Diffuse tenderness throughout all fingers.  No other tenderness over the entire hand.  Limitation of index finger flexion secondary to pain. no tenderness along the flexor tendons.  No expressible purulent drainage.  Two-point discrimination decreased at the distal index finger.     Neurologic: Alert & oriented x 3, no focal neuro deficits Psychiatric: Speech and behavior appropriate   ED Course   Medications  cefTRIAXone (ROCEPHIN) injection 1 g (1 g Intramuscular Given 05/26/18 1830)    Orders Placed This Encounter  Procedures  . DG Hand Complete Right    Standing Status:   Standing    Number of Occurrences:   1    Order  Specific Question:   Reason for Exam (SYMPTOM  OR DIAGNOSIS REQUIRED)    Answer:   multiple puncture wounds from dog bite    Order Specific Question:   Is patient pregnant?    Answer:   Unknown (Please Explain)    No results found for this or any previous visit (from the past 24 hour(s)). Dg Hand Complete Right  Result Date: 05/26/2018 CLINICAL DATA:  Right-sided hand pain with multiple puncture wounds from a dog bite that occurred 1 day ago. EXAM: RIGHT HAND - COMPLETE 3+ VIEW COMPARISON:  None. FINDINGS: No acute fracture, joint dislocation or radiopaque foreign body. Mild degenerative change of the DIP and PIP joints of the second through fifth digits and interphalangeal joint of the thumb. Degenerative change with joint space narrowing is also seen of the first MCP. Carpal rows are maintained. Slight periarticular osteopenia without erosive change or periostitis. Radiographically occult puncture wounds from reported dog bite. IMPRESSION: No acute osseous abnormality. No radiopaque foreign body. Electronically Signed   By: Tollie Eth M.D.   On: 05/26/2018 18:30    ED Clinical Impression  Dog bite of multiple sites of right hand and fingers, initial encounter   ED Assessment/Plan  X-raying hand to rule out any fractures.  Patient's tetanus is up-to-date.  Giving gram of Rocephin here.  There is no evidence of flexor tenosynovitis along the index finger at this point in time, however discussed with patient that hand wounds are unpredictable.  We will send her home with Augmentin for 10 days, ibuprofen 600 mg combined with 1 g of Tylenol together 3 or 4 times a day as needed for pain, ice for the first 72 hours then heat.  Referring to Dr. Derl Barrow, hand surgery on-call if not getting better in 3 days, to the ER if she gets worse.  Reviewed imaging independently.  Discussed with radiology.  No fracture.  No radiopaque foreign bodies.  See radiology report for full details.  Discussed  imaging, MDM, treatment plan, and plan for follow-up with patient. Discussed sn/sx that should prompt return to the ED. patient agrees with plan.   Meds ordered this encounter  Medications  . cefTRIAXone (ROCEPHIN) injection 1 g  . amoxicillin-clavulanate (AUGMENTIN) 875-125 MG tablet    Sig: Take 1 tablet by mouth 2 (two) times daily for 10 days.    Dispense:  20 tablet    Refill:  0  . ibuprofen (ADVIL,MOTRIN) 600 MG tablet    Sig: Take 1 tablet (600 mg total) by mouth every 6 (six) hours as needed.    Dispense:  20 tablet    Refill:  0    *This clinic note was created using Scientist, clinical (histocompatibility and immunogenetics). Therefore, there may be occasional mistakes despite careful proofreading.   ?  Domenick GongMortenson, Benelli Winther, MD 05/26/18 1850

## 2018-05-26 NOTE — ED Notes (Signed)
Patient able to ambulate independently  

## 2019-08-17 ENCOUNTER — Encounter: Payer: Self-pay | Admitting: Gastroenterology

## 2019-08-31 DIAGNOSIS — L680 Hirsutism: Secondary | ICD-10-CM | POA: Insufficient documentation

## 2019-08-31 DIAGNOSIS — K5900 Constipation, unspecified: Secondary | ICD-10-CM | POA: Insufficient documentation

## 2019-08-31 DIAGNOSIS — R635 Abnormal weight gain: Secondary | ICD-10-CM | POA: Insufficient documentation

## 2019-08-31 DIAGNOSIS — IMO0001 Reserved for inherently not codable concepts without codable children: Secondary | ICD-10-CM | POA: Insufficient documentation

## 2019-09-22 ENCOUNTER — Encounter: Payer: 59 | Admitting: Gastroenterology

## 2019-11-06 ENCOUNTER — Other Ambulatory Visit: Payer: Self-pay

## 2019-11-06 ENCOUNTER — Ambulatory Visit (HOSPITAL_COMMUNITY): Payer: Self-pay

## 2019-11-06 ENCOUNTER — Ambulatory Visit
Admission: RE | Admit: 2019-11-06 | Discharge: 2019-11-06 | Disposition: A | Discharge status: Home / Self Care | Payer: 59 | Source: Ambulatory Visit | Attending: Emergency Medicine | Admitting: Emergency Medicine

## 2019-11-06 DIAGNOSIS — J069 Acute upper respiratory infection, unspecified: Secondary | ICD-10-CM | POA: Diagnosis not present

## 2019-11-06 MED ORDER — BENZONATATE 100 MG PO CAPS: 100.0000 mg | ORAL_CAPSULE | Freq: Three times a day (TID) | ORAL | 0 refills | Status: DC

## 2019-11-06 MED ORDER — CETIRIZINE HCL 10 MG PO TABS
10.0000 mg | ORAL_TABLET | Freq: Every day | ORAL | 0 refills | Status: DC
Start: 2019-11-06 — End: 2020-09-12

## 2019-11-06 MED ORDER — FLUTICASONE PROPIONATE 50 MCG/ACT NA SUSP: 1.0000 | Freq: Every day | NASAL | 0 refills | Status: DC

## 2019-11-06 MED ORDER — CETIRIZINE HCL 10 MG PO TABS: 10.0000 mg | ORAL_TABLET | Freq: Every day | ORAL | 0 refills | Status: DC

## 2019-11-06 MED ORDER — FLUTICASONE PROPIONATE 50 MCG/ACT NA SUSP
1.0000 | Freq: Every day | NASAL | 0 refills | Status: DC
Start: 2019-11-06 — End: 2020-09-12

## 2019-11-06 NOTE — ED Triage Notes (Signed)
Pt presents to [facility] for assessment of cough, stuffy nose, bilateral ear pain, headache x 3 days.  Denies fevers, denies sore throat, denies body aches.

## 2019-11-06 NOTE — Discharge Instructions (Signed)

## 2019-11-06 NOTE — ED Provider Notes (Signed)
[name]    CSN: [id number] Arrival date & time: 11/06/19  1244      History   Chief Complaint Chief Complaint  Patient presents with  . URI    HPI Tracy Sellers is a 54 y.o. female presenting for URI symptoms x3 days.  Patient provides history: Endorsing dry cough, nasal congestion, bilateral ear popping with generalized headache.  No fever, sore throat, difficulty breathing or swallowing.  No known sick contacts.  Tried OTC cold medications without relief.   Past Medical History:  Diagnosis Date  . GERD (gastroesophageal reflux disease)   . Headache     Patient Active Problem List   Diagnosis Date Noted  . Abnormal weight gain 08/31/2019  . Blush 08/31/2019  . CN (constipation) 08/31/2019  . Hairiness 08/31/2019  . Chest pain 11/01/2013    Past Surgical History:  Procedure Laterality Date  . TONSILLECTOMY AND ADENOIDECTOMY    . VAGINAL HYSTERECTOMY  2004   with bladder tact and rectal prolapse repair    OB History   No obstetric history on file.      Home Medications    Prior to Admission medications   Medication Sig Start Date End Date Taking? Authorizing Provider  benzonatate (TESSALON) 100 MG capsule Take 1 capsule (100 mg total) by mouth every 8 (eight) hours. 11/06/19   Hall-Potvin, [place], PA-C  cetirizine (ZYRTEC ALLERGY) 10 MG tablet Take 1 tablet (10 mg total) by mouth daily. 11/06/19   Hall-Potvin, [place], PA-C  fluticasone (FLONASE) 50 MCG/ACT nasal spray Place 1 spray into both nostrils daily. 11/06/19   Hall-Potvin, [place], PA-C  ibuprofen (ADVIL,MOTRIN) 600 MG tablet Take 1 tablet (600 mg total) by mouth every 6 (six) hours as needed. 05/26/18   Domenick Gong, MD  omeprazole (PRILOSEC) 40 MG capsule Take 1 capsule (40 mg total) by mouth 2 (two) times daily. 01/26/15   Meryl Dare, MD    Family History Family History  Problem Relation Age of Onset  . CAD Mother   . Diabetes Mother   . Heart failure Mother         CHF  . Kidney disease Mother   . Heart disease Father        MI  . Colon polyps Father   . Breast cancer Paternal Grandmother   . Prostate cancer Paternal Uncle        great uncle  . Diabetes Maternal Grandfather   . Stroke Maternal Grandfather   . Diabetes Maternal Grandmother   . Heart failure Maternal Grandmother        CHF  . Diabetes Maternal Uncle   . Diabetes Maternal Aunt   . Lung cancer Paternal Grandfather   . Leukemia Maternal Uncle        Hairy Cell    Social History Social History   Tobacco Use  . Smoking status: Passive Smoke Exposure - Never Smoker  . Smokeless tobacco: Never Used  Substance Use Topics  . Alcohol use: No    Alcohol/week: 0.0 standard drinks  . Drug use: No     Allergies   Patient has no known allergies.   Review of Systems As per HPI   Physical Exam Triage Vital Signs ED Triage Vitals  Enc Vitals Group     BP      Pulse      Resp      Temp      Temp src      SpO2  Weight      Height      Head Circumference      Peak Flow      Pain Score      Pain Loc      Pain Edu?      Excl. in GC?    No data found.  Updated Vital Signs There were no vitals taken for this visit.  Visual Acuity Right Eye Distance:   Left Eye Distance:   Bilateral Distance:    Right Eye Near:   Left Eye Near:    Bilateral Near:     Physical Exam Constitutional:      General: She is not in acute distress.    Appearance: She is obese. She is not ill-appearing or diaphoretic.  HENT:     Head: Normocephalic and atraumatic.     Mouth/Throat:     Mouth: Mucous membranes are moist.     Pharynx: Oropharynx is clear. No oropharyngeal exudate or posterior oropharyngeal erythema.  Eyes:     General: No scleral icterus.    Conjunctiva/sclera: Conjunctivae normal.     Pupils: Pupils are equal, round, and reactive to light.  Neck:     Comments: Trachea midline, negative JVD Cardiovascular:     Rate and Rhythm: Normal rate and regular  rhythm.     Heart sounds: No murmur heard.  No gallop.   Pulmonary:     Effort: Pulmonary effort is normal. No respiratory distress.     Breath sounds: No wheezing, rhonchi or rales.  Musculoskeletal:     Cervical back: Neck supple. No tenderness.  Lymphadenopathy:     Cervical: No cervical adenopathy.  Skin:    Capillary Refill: Capillary refill takes less than 2 seconds.     Coloration: Skin is not jaundiced or pale.     Findings: No rash.  Neurological:     General: No focal deficit present.     Mental Status: She is alert and oriented to person, place, and time.      UC Treatments / Results  Labs (all labs ordered are listed, but only abnormal results are displayed) Labs Reviewed  NOVEL CORONAVIRUS, NAA    EKG   Radiology No results found.  Procedures Procedures (including critical care time)  Medications Ordered in UC Medications - No data to display  Initial Impression / Assessment and Plan / UC Course  I have reviewed the triage vital signs and the nursing notes.  Pertinent labs & imaging results that were available during my care of the patient were reviewed by me and considered in my medical decision making (see chart for details).     Patient afebrile, nontoxic.  No respiratory distress.  Covid PCR pending.  Patient to quarantine until results are back.  We will treat supportively as outlined below.  Return precautions discussed, patient verbalized understanding and is agreeable to plan. Final Clinical Impressions(s) / UC Diagnoses   Final diagnoses:  URI with cough and congestion     Discharge Instructions     Tessalon for cough. Start flonase, atrovent nasal spray for nasal congestion/drainage. You can use over the counter nasal saline rinse such as neti pot for nasal congestion. Keep hydrated, your urine should be clear to pale yellow in color. Tylenol/motrin for fever and pain. Monitor for any worsening of symptoms, chest pain, shortness of  breath, wheezing, swelling of the throat, go to the emergency department for further evaluation needed.     ED Prescriptions    Medication  Sig Dispense Auth. Provider   cetirizine (ZYRTEC ALLERGY) 10 MG tablet  (Status: Discontinued) Take 1 tablet (10 mg total) by mouth daily. 30 tablet Hall-Potvin, [place], PA-C   fluticasone (FLONASE) 50 MCG/ACT nasal spray  (Status: Discontinued) Place 1 spray into both nostrils daily. 16 g Hall-Potvin, [place], PA-C   benzonatate (TESSALON) 100 MG capsule  (Status: Discontinued) Take 1 capsule (100 mg total) by mouth every 8 (eight) hours. 21 capsule Hall-Potvin, [place], PA-C   benzonatate (TESSALON) 100 MG capsule Take 1 capsule (100 mg total) by mouth every 8 (eight) hours. 21 capsule Hall-Potvin, [place], PA-C   cetirizine (ZYRTEC ALLERGY) 10 MG tablet Take 1 tablet (10 mg total) by mouth daily. 30 tablet Hall-Potvin, [place], PA-C   fluticasone (FLONASE) 50 MCG/ACT nasal spray Place 1 spray into both nostrils daily. 16 g Hall-Potvin, [place], PA-C     PDMP not reviewed this encounter.   Hall-Potvin, [place], [place] 11/06/19 1407

## 2019-11-07 LAB — NOVEL CORONAVIRUS, NAA: SARS-CoV-2, NAA: NOT DETECTED

## 2019-11-07 LAB — SARS-COV-2, NAA 2 DAY TAT

## 2019-11-18 ENCOUNTER — Encounter: Payer: 59 | Admitting: Gastroenterology

## 2019-12-01 ENCOUNTER — Encounter: Payer: 59 | Admitting: Gastroenterology

## 2020-04-14 ENCOUNTER — Other Ambulatory Visit: Payer: Self-pay | Admitting: Orthopedic Surgery

## 2020-04-14 DIAGNOSIS — M545 Low back pain, unspecified: Secondary | ICD-10-CM

## 2020-05-12 ENCOUNTER — Ambulatory Visit
Admission: RE | Admit: 2020-05-12 | Discharge: 2020-05-12 | Disposition: A | Discharge status: Home / Self Care | Payer: 59 | Source: Ambulatory Visit | Attending: Orthopedic Surgery | Admitting: Orthopedic Surgery

## 2020-05-12 ENCOUNTER — Other Ambulatory Visit: Payer: Self-pay

## 2020-05-12 DIAGNOSIS — M545 Low back pain, unspecified: Secondary | ICD-10-CM

## 2020-09-12 ENCOUNTER — Other Ambulatory Visit: Payer: Self-pay

## 2020-09-12 ENCOUNTER — Ambulatory Visit
Admission: EM | Admit: 2020-09-12 | Discharge: 2020-09-12 | Disposition: A | Discharge status: Home / Self Care | Payer: 59 | Attending: Emergency Medicine | Admitting: Emergency Medicine

## 2020-09-12 DIAGNOSIS — N10 Acute tubulo-interstitial nephritis: Secondary | ICD-10-CM | POA: Diagnosis present

## 2020-09-12 LAB — POCT URINALYSIS DIP (MANUAL ENTRY)
Bilirubin, UA: NEGATIVE
Glucose, UA: NEGATIVE mg/dL
Ketones, POC UA: NEGATIVE mg/dL
Nitrite, UA: POSITIVE — AB
Spec Grav, UA: >=1.03 — AB (ref 1.010–1.025)
Urobilinogen, UA: 1 E.U./dL
pH, UA: 5 (ref 5.0–8.0)

## 2020-09-12 MED ORDER — ONDANSETRON 4 MG PO TBDP
4.0000 mg | ORAL_TABLET | Freq: Three times a day (TID) | ORAL | 0 refills | Status: DC | PRN
Start: 2020-09-12 — End: 2023-09-30

## 2020-09-12 MED ORDER — SULFAMETHOXAZOLE-TRIMETHOPRIM 800-160 MG PO TABS
1.0000 | ORAL_TABLET | Freq: Two times a day (BID) | ORAL | 0 refills | Status: AC
Start: 2020-09-12 — End: 2020-09-19

## 2020-09-12 MED ORDER — CEFTRIAXONE SODIUM 1 G IJ SOLR
1.0000 g | Freq: Once | INTRAMUSCULAR | Status: AC
  Administered 2020-09-12: 1 g via INTRAMUSCULAR

## 2020-09-12 NOTE — ED Provider Notes (Signed)
[name]    CSN: [id number] Arrival date & time: 09/12/20  1737      History   Chief Complaint Chief Complaint  Patient presents with  . Flank Pain    HPI [name] is a 55 y.o. female presenting today for evaluation of possible UTI.  Reports over the past 2 weeks has had right-sided flank pain which has been intermittently.  Recently has developed increased nausea, generalized fatigue as well as hot and cold chills.  Denies any known fevers.  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  Denies urinary symptoms of dysuria, increased frequency urgency, hematuria.  HPI  Past Medical History:  Diagnosis Date  . GERD (gastroesophageal reflux disease)   . Headache     Patient Active Problem List   Diagnosis Date Noted  . Abnormal weight gain 08/31/2019  . Blush 08/31/2019  . CN (constipation) 08/31/2019  . Hairiness 08/31/2019  . Chest pain 11/01/2013    Past Surgical History:  Procedure Laterality Date  . TONSILLECTOMY AND ADENOIDECTOMY    . VAGINAL HYSTERECTOMY  2004   with bladder tact and rectal prolapse repair    OB History   No obstetric history on file.      Home Medications    Prior to Admission medications   Medication Sig Start Date End Date Taking? Authorizing Provider  ondansetron (ZOFRAN ODT) 4 MG disintegrating tablet Take 1 tablet (4 mg total) by mouth every 8 (eight) hours as needed for nausea or vomiting. 09/12/20  Yes Yaqub Arney C, PA-C  sulfamethoxazole-trimethoprim (BACTRIM DS) 800-160 MG tablet Take 1 tablet by mouth 2 (two) times daily for 7 days. 09/12/20 09/19/20 Yes Aden Youngman C, PA-C  ibuprofen (ADVIL,MOTRIN) 600 MG tablet Take 1 tablet (600 mg total) by mouth every 6 (six) hours as needed. 05/26/18   Domenick Gong, MD  omeprazole (PRILOSEC) 40 MG capsule Take 1 capsule (40 mg total) by mouth 2 (two) times daily. 01/26/15 09/12/20  Meryl Dare, MD    Family  History Family History  Problem Relation Age of Onset  . CAD Mother   . Diabetes Mother   . Heart failure Mother        CHF  . Kidney disease Mother   . Heart disease Father        MI  . Colon polyps Father   . Breast cancer Paternal Grandmother   . Prostate cancer Paternal Uncle        great uncle  . Diabetes Maternal Grandfather   . Stroke Maternal Grandfather   . Diabetes Maternal Grandmother   . Heart failure Maternal Grandmother        CHF  . Diabetes Maternal Uncle   . Diabetes Maternal Aunt   . Lung cancer Paternal Grandfather   . Leukemia Maternal Uncle        Hairy Cell    Social History Social History   Tobacco Use  . Smoking status: Passive Smoke Exposure - Never Smoker  . Smokeless tobacco: Never Used  Substance Use Topics  . Alcohol use: No    Alcohol/week: 0.0 standard drinks  . Drug use: No     Allergies   Patient has no known allergies.   Review of Systems Review of Systems  Constitutional: Negative for fever.  Respiratory: Negative for shortness of breath.   Cardiovascular: Negative for chest pain.  Gastrointestinal: Negative for abdominal pain, diarrhea, nausea and vomiting.  Genitourinary: Positive for flank pain. Negative for dysuria, genital sores, hematuria, menstrual problem, vaginal bleeding, vaginal discharge and vaginal pain.  Musculoskeletal: Negative for back pain.  Skin: Negative for rash.  Neurological: Negative for dizziness, light-headedness and headaches.     Physical Exam Triage Vital Signs ED Triage Vitals  Enc Vitals Group     BP      Pulse      Resp      Temp      Temp src      SpO2      Weight      Height      Head Circumference      Peak Flow      Pain Score      Pain Loc      Pain Edu?      Excl. in GC?    No data found.  Updated Vital Signs BP 124/74 (BP Location: Left Arm)   Pulse 95   Temp 98.7 F (37.1 C) (Oral)   Resp 18   SpO2 98%   Visual Acuity Right Eye Distance:   Left Eye  Distance:   Bilateral Distance:    Right Eye Near:   Left Eye Near:    Bilateral Near:     Physical Exam Vitals and nursing note reviewed.  Constitutional:      Appearance: She is well-developed.     Comments: No acute distress  HENT:     Head: Normocephalic and atraumatic.     Nose: Nose normal.  Eyes:     Conjunctiva/sclera: Conjunctivae normal.  Cardiovascular:     Rate and Rhythm: Normal rate and regular rhythm.  Pulmonary:     Effort: Pulmonary effort is normal. No respiratory distress.     Comments: Breathing comfortably at rest, CTABL, no wheezing, rales or other adventitious sounds auscultated Abdominal:     General: There is no distension.  Musculoskeletal:        General: Normal range of motion.     Cervical back: Neck supple.  Skin:    General: Skin is warm and dry.  Neurological:     Mental Status: She is alert and oriented to person, place, and time.      UC Treatments / Results  Labs (all labs ordered are listed, but only abnormal results are displayed) Labs Reviewed  POCT URINALYSIS DIP (MANUAL ENTRY) - Abnormal; Notable for the following components:      Result Value   Spec Grav, UA >=1.030 (*)    Blood, UA large (*)    Protein Ur, POC trace (*)    Nitrite, UA Positive (*)    Leukocytes, UA Trace (*)    All other components within normal limits  URINE CULTURE    EKG   Radiology No results found.  Procedures Procedures (including critical care time)  Medications Ordered in UC Medications  cefTRIAXone (ROCEPHIN) injection 1 g (has no administration in time range)    Initial Impression / Assessment and Plan / UC Course  I have reviewed the triage vital signs and the nursing notes.  Pertinent labs & imaging results that were available during my care of the patient were reviewed by me and considered in my medical decision making (see chart for details).     UA with trace leuks, positive nitrites and large hemoglobin, consistent with  UTI, given length of symptoms and recent nausea, flank pain and generalized fatigue opting to go ahead and cover for pyelonephritis despite no fever today  in clinic.  Rocephin prior to discharge, Bactrim twice daily x1 week, urine culture pending.  Push fluids.  Monitor for gradual resolution of symptoms.  Discussed strict return precautions. Patient verbalized understanding and is agreeable with plan.  Final Clinical Impressions(s) / UC Diagnoses   Final diagnoses:  Acute pyelonephritis     Discharge Instructions     We are treating you for UTI We gave you a shot of Rocephin, continue with Bactrim twice daily for 1 week Zofran as needed for nausea Tylenol as needed for any fevers, body aches, back pain Rest and fluids Follow-up if not improving or worsening    ED Prescriptions    Medication Sig Dispense Auth. Provider   sulfamethoxazole-trimethoprim (BACTRIM DS) 800-160 MG tablet Take 1 tablet by mouth 2 (two) times daily for 7 days. 14 tablet Jama Mcmiller C, PA-C   ondansetron (ZOFRAN ODT) 4 MG disintegrating tablet Take 1 tablet (4 mg total) by mouth every 8 (eight) hours as needed for nausea or vomiting. 20 tablet Alpa Salvo, [place] C, PA-C     PDMP not reviewed this encounter.   Lew Dawes, [place] 09/12/20 1858

## 2020-09-12 NOTE — ED Triage Notes (Signed)
Pt c/o rt flank pain off and on x2wks. States today pain is worse and nausea. Denies urinary sx's.

## 2020-09-12 NOTE — Discharge Instructions (Signed)
We are treating you for UTI We gave you a shot of Rocephin, continue with Bactrim twice daily for 1 week Zofran as needed for nausea Tylenol as needed for any fevers, body aches, back pain Rest and fluids Follow-up if not improving or worsening

## 2020-09-15 LAB — URINE CULTURE: Culture: 80000 — AB

## 2021-05-27 ENCOUNTER — Encounter: Payer: Self-pay | Admitting: Emergency Medicine

## 2021-05-27 ENCOUNTER — Ambulatory Visit
Admission: EM | Admit: 2021-05-27 | Discharge: 2021-05-27 | Disposition: A | Discharge status: Home / Self Care | Payer: 59 | Attending: Physician Assistant | Admitting: Physician Assistant

## 2021-05-27 ENCOUNTER — Other Ambulatory Visit: Payer: Self-pay

## 2021-05-27 DIAGNOSIS — J209 Acute bronchitis, unspecified: Secondary | ICD-10-CM | POA: Diagnosis not present

## 2021-05-27 MED ORDER — PREDNISONE 20 MG PO TABS: 40.0000 mg | ORAL_TABLET | Freq: Every day | ORAL | 0 refills | Status: AC

## 2021-05-27 MED ORDER — AZITHROMYCIN 250 MG PO TABS: 250.0000 mg | ORAL_TABLET | Freq: Every day | ORAL | 0 refills | Status: DC

## 2021-05-27 NOTE — ED Triage Notes (Addendum)
Pt here for cough and congestion x 2 weeks; pt has had neg covid, flu and strep; pt sts lump in back of head

## 2021-05-27 NOTE — ED Provider Notes (Signed)
[name]    CSN: [id number] Arrival date & time: 05/27/21  1435      History   Chief Complaint Chief Complaint  Patient presents with   Cough    HPI Tracy Sellers is a 56 y.o. female.   Patient here today for evaluation of cough that she has had for 2 weeks.  She has had negative COVID screening as well as flu and strep screening.  She has not had fever.  Husband is here today with similar symptoms that he has had for several weeks longer than her.  The history is provided by the patient.  Cough Associated symptoms: sore throat   Associated symptoms: no chills, no eye discharge, no fever, no shortness of breath and no wheezing    Past Medical History:  Diagnosis Date   GERD (gastroesophageal reflux disease)    Headache     Patient Active Problem List   Diagnosis Date Noted   Abnormal weight gain 08/31/2019   Blush 08/31/2019   CN (constipation) 08/31/2019   Hairiness 08/31/2019   Chest pain 11/01/2013    Past Surgical History:  Procedure Laterality Date   TONSILLECTOMY AND ADENOIDECTOMY     VAGINAL HYSTERECTOMY  2004   with bladder tact and rectal prolapse repair    OB History   No obstetric history on file.      Home Medications    Prior to Admission medications   Medication Sig Start Date End Date Taking? Authorizing Provider  azithromycin (ZITHROMAX) 250 MG tablet Take 1 tablet (250 mg total) by mouth daily. Take first 2 tablets together, then 1 every day until finished. 05/27/21  Yes Tomi Bamberger, PA-C  predniSONE (DELTASONE) 20 MG tablet Take 2 tablets (40 mg total) by mouth daily with breakfast for 5 days. 05/27/21 06/01/21 Yes Tomi Bamberger, PA-C  ibuprofen (ADVIL,MOTRIN) 600 MG tablet Take 1 tablet (600 mg total) by mouth every 6 (six) hours as needed. 05/26/18   Domenick Gong, MD  ondansetron (ZOFRAN ODT) 4 MG disintegrating tablet Take 1 tablet (4 mg total) by mouth every 8 (eight) hours as needed for nausea or vomiting.  09/12/20   Wieters, Hallie C, PA-C  omeprazole (PRILOSEC) 40 MG capsule Take 1 capsule (40 mg total) by mouth 2 (two) times daily. 01/26/15 09/12/20  Meryl Dare, MD    Family History Family History  Problem Relation Age of Onset   CAD Mother    Diabetes Mother    Heart failure Mother        CHF   Kidney disease Mother    Heart disease Father        MI   Colon polyps Father    Breast cancer Paternal Grandmother    Prostate cancer Paternal Uncle        great uncle   Diabetes Maternal Grandfather    Stroke Maternal Grandfather    Diabetes Maternal Grandmother    Heart failure Maternal Grandmother        CHF   Diabetes Maternal Uncle    Diabetes Maternal Aunt    Lung cancer Paternal Grandfather    Leukemia Maternal Uncle        Hairy Cell    Social History Social History   Tobacco Use   Smoking status: Passive Smoke Exposure - Never Smoker   Smokeless tobacco: Never  Substance Use Topics   Alcohol use: No    Alcohol/week: 0.0 standard drinks   Drug use: No  Allergies   Patient has no known allergies.   Review of Systems Review of Systems  Constitutional:  Negative for chills and fever.  HENT:  Positive for congestion and sore throat.   Eyes:  Negative for discharge and redness.  Respiratory:  Positive for cough. Negative for shortness of breath and wheezing.   Gastrointestinal:  Negative for abdominal pain, diarrhea, nausea and vomiting.    Physical Exam Triage Vital Signs ED Triage Vitals [05/27/21 1517]  Enc Vitals Group     BP (!) 148/78     Pulse Rate 82     Resp 18     Temp 98.2 F (36.8 C)     Temp Source Oral     SpO2 97 %     Weight      Height      Head Circumference      Peak Flow      Pain Score 5     Pain Loc      Pain Edu?      Excl. in GC?    No data found.  Updated Vital Signs BP (!) 148/78 (BP Location: Left Arm)    Pulse 82    Temp 98.2 F (36.8 C) (Oral)    Resp 18    SpO2 97%      Physical Exam Vitals and nursing  note reviewed.  Constitutional:      General: She is not in acute distress.    Appearance: Normal appearance. She is not ill-appearing.  HENT:     Head: Normocephalic and atraumatic.     Nose: Congestion present.  Eyes:     Conjunctiva/sclera: Conjunctivae normal.  Cardiovascular:     Rate and Rhythm: Normal rate and regular rhythm.     Heart sounds: Normal heart sounds. No murmur heard. Pulmonary:     Effort: Pulmonary effort is normal. No respiratory distress.     Breath sounds: Normal breath sounds. No wheezing, rhonchi or rales.  Skin:    General: Skin is warm and dry.  Neurological:     Mental Status: She is alert.  Psychiatric:        Mood and Affect: Mood normal.        Thought Content: Thought content normal.     UC Treatments / Results  Labs (all labs ordered are listed, but only abnormal results are displayed) Labs Reviewed - No data to display  EKG   Radiology No results found.  Procedures Procedures (including critical care time)  Medications Ordered in UC Medications - No data to display  Initial Impression / Assessment and Plan / UC Course  I have reviewed the triage vital signs and the nursing notes.  Pertinent labs & imaging results that were available during my care of the patient were reviewed by me and considered in my medical decision making (see chart for details).  Prednisone and Z-Pak prescribed for suspected bronchitis and coverage of atypical pna.  Recommended follow-up if symptoms fail to improve or worsen with treatment.   Final Clinical Impressions(s) / UC Diagnoses   Final diagnoses:  Acute bronchitis, unspecified organism   Discharge Instructions   None    ED Prescriptions     Medication Sig Dispense Auth. Provider   predniSONE (DELTASONE) 20 MG tablet Take 2 tablets (40 mg total) by mouth daily with breakfast for 5 days. 10 tablet Erma Pinto F, PA-C   azithromycin (ZITHROMAX) 250 MG tablet Take 1 tablet (250 mg total)  by mouth daily. Take  first 2 tablets together, then 1 every day until finished. 6 tablet Tomi BambergerMyers, Tahj Lindseth F, PA-C      PDMP not reviewed this encounter.   Tomi BambergerMyers, Karin Griffith F, PA-C 05/27/21 1615

## 2022-01-21 DIAGNOSIS — Z8249 Family history of ischemic heart disease and other diseases of the circulatory system: Secondary | ICD-10-CM | POA: Diagnosis not present

## 2022-01-21 DIAGNOSIS — Z13220 Encounter for screening for lipoid disorders: Secondary | ICD-10-CM | POA: Diagnosis not present

## 2022-01-21 DIAGNOSIS — R030 Elevated blood-pressure reading, without diagnosis of hypertension: Secondary | ICD-10-CM | POA: Diagnosis not present

## 2022-01-21 DIAGNOSIS — Z23 Encounter for immunization: Secondary | ICD-10-CM | POA: Diagnosis not present

## 2022-01-21 DIAGNOSIS — Z6841 Body Mass Index (BMI) 40.0 and over, adult: Secondary | ICD-10-CM | POA: Diagnosis not present

## 2022-04-07 DIAGNOSIS — M25561 Pain in right knee: Secondary | ICD-10-CM | POA: Diagnosis not present

## 2022-04-18 DIAGNOSIS — J0100 Acute maxillary sinusitis, unspecified: Secondary | ICD-10-CM | POA: Diagnosis not present

## 2022-05-02 DIAGNOSIS — R7303 Prediabetes: Secondary | ICD-10-CM | POA: Diagnosis not present

## 2022-05-02 DIAGNOSIS — Z0189 Encounter for other specified special examinations: Secondary | ICD-10-CM | POA: Diagnosis not present

## 2022-05-16 ENCOUNTER — Other Ambulatory Visit (HOSPITAL_COMMUNITY): Payer: Self-pay

## 2022-05-16 DIAGNOSIS — R300 Dysuria: Secondary | ICD-10-CM | POA: Diagnosis not present

## 2022-05-16 MED ORDER — CIPROFLOXACIN HCL 500 MG PO TABS
500.0000 mg | ORAL_TABLET | Freq: Two times a day (BID) | ORAL | 0 refills | Status: DC
  Filled 2022-05-16: qty 10, 5d supply, fill #0

## 2022-07-01 DIAGNOSIS — Z1331 Encounter for screening for depression: Secondary | ICD-10-CM | POA: Diagnosis not present

## 2022-07-01 DIAGNOSIS — R7303 Prediabetes: Secondary | ICD-10-CM | POA: Diagnosis not present

## 2022-07-01 DIAGNOSIS — E8889 Other specified metabolic disorders: Secondary | ICD-10-CM | POA: Diagnosis not present

## 2022-07-01 DIAGNOSIS — K219 Gastro-esophageal reflux disease without esophagitis: Secondary | ICD-10-CM | POA: Diagnosis not present

## 2022-07-15 DIAGNOSIS — R7303 Prediabetes: Secondary | ICD-10-CM | POA: Diagnosis not present

## 2022-07-15 DIAGNOSIS — K219 Gastro-esophageal reflux disease without esophagitis: Secondary | ICD-10-CM | POA: Diagnosis not present

## 2022-07-15 DIAGNOSIS — Z6841 Body Mass Index (BMI) 40.0 and over, adult: Secondary | ICD-10-CM | POA: Diagnosis not present

## 2022-07-16 ENCOUNTER — Other Ambulatory Visit (HOSPITAL_COMMUNITY): Payer: Self-pay

## 2022-07-16 MED ORDER — WEGOVY 0.5 MG/0.5ML ~~LOC~~ SOAJ
SUBCUTANEOUS | 0 refills | Status: DC
  Filled 2022-07-16: qty 2, 30d supply, fill #0

## 2022-07-17 ENCOUNTER — Other Ambulatory Visit (HOSPITAL_COMMUNITY): Payer: Self-pay

## 2022-07-18 ENCOUNTER — Other Ambulatory Visit (HOSPITAL_COMMUNITY): Payer: Self-pay

## 2022-07-19 ENCOUNTER — Other Ambulatory Visit (HOSPITAL_COMMUNITY): Payer: Self-pay

## 2022-07-23 ENCOUNTER — Other Ambulatory Visit (HOSPITAL_COMMUNITY): Payer: Self-pay

## 2022-09-06 ENCOUNTER — Other Ambulatory Visit (HOSPITAL_COMMUNITY): Payer: Self-pay

## 2022-09-10 ENCOUNTER — Other Ambulatory Visit (HOSPITAL_COMMUNITY): Payer: Self-pay

## 2022-09-11 ENCOUNTER — Other Ambulatory Visit (HOSPITAL_COMMUNITY): Payer: Self-pay

## 2022-09-11 MED ORDER — WEGOVY 0.5 MG/0.5ML ~~LOC~~ SOAJ
0.5000 mg | SUBCUTANEOUS | 0 refills | Status: DC
  Filled 2022-09-11: qty 2, 28d supply, fill #0

## 2022-09-24 DIAGNOSIS — K219 Gastro-esophageal reflux disease without esophagitis: Secondary | ICD-10-CM | POA: Diagnosis not present

## 2022-09-24 DIAGNOSIS — G4719 Other hypersomnia: Secondary | ICD-10-CM | POA: Diagnosis not present

## 2022-09-24 DIAGNOSIS — R7303 Prediabetes: Secondary | ICD-10-CM | POA: Diagnosis not present

## 2022-09-24 DIAGNOSIS — K5903 Drug induced constipation: Secondary | ICD-10-CM | POA: Diagnosis not present

## 2022-09-24 DIAGNOSIS — R252 Cramp and spasm: Secondary | ICD-10-CM | POA: Diagnosis not present

## 2022-09-25 ENCOUNTER — Other Ambulatory Visit (HOSPITAL_COMMUNITY): Payer: Self-pay

## 2022-09-25 MED ORDER — LINZESS 72 MCG PO CAPS
72.0000 ug | ORAL_CAPSULE | Freq: Every day | ORAL | 0 refills | Status: DC
  Filled 2022-09-25 – 2022-10-04 (×2): qty 30, 30d supply, fill #0
  Filled 2022-10-04: qty 90, 90d supply, fill #0

## 2022-09-25 MED ORDER — WEGOVY 1 MG/0.5ML ~~LOC~~ SOAJ
1.0000 mg | SUBCUTANEOUS | 0 refills | Status: DC
  Filled 2022-09-25 – 2022-10-04 (×2): qty 2, 28d supply, fill #0

## 2022-09-26 ENCOUNTER — Other Ambulatory Visit (HOSPITAL_COMMUNITY): Payer: Self-pay

## 2022-09-27 ENCOUNTER — Other Ambulatory Visit: Payer: Self-pay

## 2022-10-04 ENCOUNTER — Other Ambulatory Visit (HOSPITAL_COMMUNITY): Payer: Self-pay

## 2022-10-17 DIAGNOSIS — Z9189 Other specified personal risk factors, not elsewhere classified: Secondary | ICD-10-CM | POA: Diagnosis not present

## 2022-10-17 DIAGNOSIS — K219 Gastro-esophageal reflux disease without esophagitis: Secondary | ICD-10-CM | POA: Diagnosis not present

## 2022-10-17 DIAGNOSIS — R7303 Prediabetes: Secondary | ICD-10-CM | POA: Diagnosis not present

## 2022-10-17 DIAGNOSIS — K5903 Drug induced constipation: Secondary | ICD-10-CM | POA: Diagnosis not present

## 2022-10-18 ENCOUNTER — Other Ambulatory Visit (HOSPITAL_COMMUNITY): Payer: Self-pay

## 2022-10-18 MED ORDER — WEGOVY 1 MG/0.5ML ~~LOC~~ SOAJ
1.0000 mg | SUBCUTANEOUS | 0 refills | Status: DC
  Filled 2022-10-18 – 2022-11-09 (×2): qty 2, 28d supply, fill #0

## 2022-11-09 ENCOUNTER — Other Ambulatory Visit (HOSPITAL_COMMUNITY): Payer: Self-pay

## 2022-12-02 DIAGNOSIS — Z9189 Other specified personal risk factors, not elsewhere classified: Secondary | ICD-10-CM | POA: Diagnosis not present

## 2022-12-02 DIAGNOSIS — K5903 Drug induced constipation: Secondary | ICD-10-CM | POA: Diagnosis not present

## 2022-12-02 DIAGNOSIS — R7303 Prediabetes: Secondary | ICD-10-CM | POA: Diagnosis not present

## 2022-12-02 DIAGNOSIS — K219 Gastro-esophageal reflux disease without esophagitis: Secondary | ICD-10-CM | POA: Diagnosis not present

## 2022-12-03 ENCOUNTER — Other Ambulatory Visit (HOSPITAL_COMMUNITY): Payer: Self-pay

## 2022-12-03 MED ORDER — ONDANSETRON HCL 4 MG PO TABS
4.0000 mg | ORAL_TABLET | Freq: Three times a day (TID) | ORAL | 0 refills | Status: DC | PRN
  Filled 2022-12-03 – 2022-12-24 (×2): qty 30, 10d supply, fill #0

## 2022-12-03 MED ORDER — LINZESS 72 MCG PO CAPS
ORAL_CAPSULE | ORAL | 0 refills | Status: DC
  Filled 2022-12-03: qty 30, 30d supply, fill #0

## 2022-12-03 MED ORDER — PANTOPRAZOLE SODIUM 40 MG PO TBEC
40.0000 mg | DELAYED_RELEASE_TABLET | Freq: Every day | ORAL | 1 refills | Status: DC
  Filled 2022-12-03 – 2022-12-24 (×2): qty 30, 30d supply, fill #0

## 2022-12-09 ENCOUNTER — Other Ambulatory Visit (HOSPITAL_COMMUNITY): Payer: Self-pay

## 2022-12-11 ENCOUNTER — Other Ambulatory Visit (HOSPITAL_COMMUNITY): Payer: Self-pay

## 2022-12-19 DIAGNOSIS — N390 Urinary tract infection, site not specified: Secondary | ICD-10-CM | POA: Diagnosis not present

## 2022-12-24 ENCOUNTER — Other Ambulatory Visit (HOSPITAL_COMMUNITY): Payer: Self-pay

## 2023-01-03 DIAGNOSIS — R7303 Prediabetes: Secondary | ICD-10-CM | POA: Diagnosis not present

## 2023-01-03 DIAGNOSIS — K219 Gastro-esophageal reflux disease without esophagitis: Secondary | ICD-10-CM | POA: Diagnosis not present

## 2023-01-03 DIAGNOSIS — R350 Frequency of micturition: Secondary | ICD-10-CM | POA: Diagnosis not present

## 2023-01-03 DIAGNOSIS — E785 Hyperlipidemia, unspecified: Secondary | ICD-10-CM | POA: Diagnosis not present

## 2023-01-15 DIAGNOSIS — K219 Gastro-esophageal reflux disease without esophagitis: Secondary | ICD-10-CM | POA: Diagnosis not present

## 2023-01-15 DIAGNOSIS — R7303 Prediabetes: Secondary | ICD-10-CM | POA: Diagnosis not present

## 2023-01-15 DIAGNOSIS — Z9189 Other specified personal risk factors, not elsewhere classified: Secondary | ICD-10-CM | POA: Diagnosis not present

## 2023-01-16 ENCOUNTER — Other Ambulatory Visit (HOSPITAL_COMMUNITY): Payer: Self-pay

## 2023-01-16 MED ORDER — PANTOPRAZOLE SODIUM 40 MG PO TBEC
40.0000 mg | DELAYED_RELEASE_TABLET | Freq: Every day | ORAL | 1 refills | Status: DC
  Filled 2023-01-16: qty 30, 30d supply, fill #0

## 2023-01-16 MED ORDER — WEGOVY 1 MG/0.5ML ~~LOC~~ SOAJ
SUBCUTANEOUS | 0 refills | Status: DC
  Filled 2023-01-16: qty 2, 28d supply, fill #0

## 2023-01-16 MED ORDER — LUBIPROSTONE 24 MCG PO CAPS
24.0000 ug | ORAL_CAPSULE | Freq: Two times a day (BID) | ORAL | 0 refills | Status: DC
  Filled 2023-01-16: qty 60, 30d supply, fill #0

## 2023-01-21 ENCOUNTER — Other Ambulatory Visit (HOSPITAL_COMMUNITY): Payer: Self-pay

## 2023-01-25 ENCOUNTER — Other Ambulatory Visit (HOSPITAL_COMMUNITY): Payer: Self-pay

## 2023-02-03 ENCOUNTER — Other Ambulatory Visit (HOSPITAL_COMMUNITY): Payer: Self-pay

## 2023-02-21 ENCOUNTER — Other Ambulatory Visit (HOSPITAL_COMMUNITY): Payer: Self-pay

## 2023-02-22 ENCOUNTER — Other Ambulatory Visit (HOSPITAL_COMMUNITY): Payer: Self-pay

## 2023-02-24 ENCOUNTER — Other Ambulatory Visit (HOSPITAL_COMMUNITY): Payer: Self-pay

## 2023-03-03 ENCOUNTER — Other Ambulatory Visit (HOSPITAL_COMMUNITY): Payer: Self-pay

## 2023-03-05 DIAGNOSIS — J069 Acute upper respiratory infection, unspecified: Secondary | ICD-10-CM | POA: Diagnosis not present

## 2023-03-05 DIAGNOSIS — J329 Chronic sinusitis, unspecified: Secondary | ICD-10-CM | POA: Diagnosis not present

## 2023-03-05 DIAGNOSIS — R519 Headache, unspecified: Secondary | ICD-10-CM | POA: Diagnosis not present

## 2023-03-05 DIAGNOSIS — R350 Frequency of micturition: Secondary | ICD-10-CM | POA: Diagnosis not present

## 2023-04-22 DIAGNOSIS — H2513 Age-related nuclear cataract, bilateral: Secondary | ICD-10-CM | POA: Diagnosis not present

## 2023-04-25 ENCOUNTER — Other Ambulatory Visit (HOSPITAL_COMMUNITY): Payer: Self-pay

## 2023-04-25 MED ORDER — NITROFURANTOIN MONOHYD MACRO 100 MG PO CAPS
100.0000 mg | ORAL_CAPSULE | Freq: Two times a day (BID) | ORAL | 0 refills | Status: DC
  Filled 2023-04-25: qty 14, 7d supply, fill #0

## 2023-08-05 DIAGNOSIS — R0683 Snoring: Secondary | ICD-10-CM | POA: Diagnosis not present

## 2023-08-11 ENCOUNTER — Emergency Department (HOSPITAL_COMMUNITY)

## 2023-08-11 ENCOUNTER — Other Ambulatory Visit: Payer: Self-pay

## 2023-08-11 ENCOUNTER — Emergency Department (HOSPITAL_COMMUNITY)
Admission: EM | Admit: 2023-08-11 | Discharge: 2023-08-12 | Disposition: A | Discharge status: Home / Self Care | Attending: Emergency Medicine | Admitting: Emergency Medicine

## 2023-08-11 ENCOUNTER — Encounter (HOSPITAL_COMMUNITY): Payer: Self-pay

## 2023-08-11 DIAGNOSIS — I10 Essential (primary) hypertension: Secondary | ICD-10-CM | POA: Diagnosis not present

## 2023-08-11 DIAGNOSIS — R0789 Other chest pain: Secondary | ICD-10-CM | POA: Insufficient documentation

## 2023-08-11 DIAGNOSIS — G4489 Other headache syndrome: Secondary | ICD-10-CM | POA: Diagnosis not present

## 2023-08-11 DIAGNOSIS — I517 Cardiomegaly: Secondary | ICD-10-CM | POA: Diagnosis not present

## 2023-08-11 DIAGNOSIS — K551 Chronic vascular disorders of intestine: Secondary | ICD-10-CM

## 2023-08-11 DIAGNOSIS — I2510 Atherosclerotic heart disease of native coronary artery without angina pectoris: Secondary | ICD-10-CM | POA: Diagnosis not present

## 2023-08-11 DIAGNOSIS — I700 Atherosclerosis of aorta: Secondary | ICD-10-CM | POA: Diagnosis not present

## 2023-08-11 DIAGNOSIS — R079 Chest pain, unspecified: Secondary | ICD-10-CM

## 2023-08-11 DIAGNOSIS — K5730 Diverticulosis of large intestine without perforation or abscess without bleeding: Secondary | ICD-10-CM | POA: Diagnosis not present

## 2023-08-11 LAB — BASIC METABOLIC PANEL WITH GFR
Anion gap: 11 (ref 5–15)
BUN: 16 mg/dL (ref 6–20)
CO2: 26 mmol/L (ref 22–32)
Calcium: 9.2 mg/dL (ref 8.9–10.3)
Chloride: 99 mmol/L (ref 98–111)
Creatinine, Ser: 0.81 mg/dL (ref 0.44–1.00)
GFR, Estimated: >60 mL/min (ref >60)
Glucose, Bld: 85 mg/dL (ref 70–99)
Potassium: 5 mmol/L (ref 3.5–5.1)
Sodium: 136 mmol/L (ref 135–145)

## 2023-08-11 LAB — CBC
HCT: 38.4 % (ref 36.0–46.0)
Hemoglobin: 12.4 g/dL (ref 12.0–15.0)
MCH: 28.4 pg (ref 26.0–34.0)
MCHC: 32.3 g/dL (ref 30.0–36.0)
MCV: 88.1 fL (ref 80.0–100.0)
Platelets: 216 10*3/uL (ref 150–400)
RBC: 4.36 MIL/uL (ref 3.87–5.11)
RDW: 13.2 % (ref 11.5–15.5)
WBC: 7 10*3/uL (ref 4.0–10.5)
nRBC: 0 % (ref 0.0–0.2)

## 2023-08-11 LAB — TROPONIN I (HIGH SENSITIVITY): Troponin I (High Sensitivity): 5 ng/L (ref <18)

## 2023-08-11 MED ORDER — IOHEXOL 350 MG/ML SOLN
100.0000 mL | Freq: Once | INTRAVENOUS | Status: AC | PRN
  Administered 2023-08-11: 100 mL via INTRAVENOUS

## 2023-08-11 NOTE — ED Triage Notes (Signed)
 Pt to ED BIB [facility] from [facility]. Patient was driving and had sudden onset CP radiating to back 10/10. Improved to 3/10 with rest. Denies SHOB.

## 2023-08-11 NOTE — ED Provider Notes (Incomplete)
 [facility] [facility] Provider Note   CSN: [id number] Arrival date & time: 08/11/23  1948     History {Add pertinent medical, surgical, social history, OB history to HPI:1} Chief Complaint  Patient presents with  . Chest Pain    Tracy Sellers is a 58 y.o. female.  Patient presents to the emergency department complaining of chest pain.  Patient states that around 7 PM this evening she was driving home and had a sudden onset 10 out of 10 pain in the center of her chest radiating straight through to her back.  She stopped at a fire department and was noted to be significantly hypertensive with a systolic blood pressure of approximately 200.  She states she has no history of hypertension.  EMS transported the patient and administered 324 mg of aspirin during transport.  Currently pain is 2 out of 10 described as a pressure.  Blood pressure has normalized as of this time.  During the event she has denied any shortness of breath, abdominal pain, nausea, or any further radiation of symptoms.  Past medical history significant for GERD.  No history of hypertension, diabetes, hyperlipidemia, tobacco usage   Chest Pain      Home Medications Prior to Admission medications   Medication Sig Start Date End Date Taking? Authorizing Provider  azithromycin (ZITHROMAX) 250 MG tablet Take 1 tablet (250 mg total) by mouth daily. Take first 2 tablets together, then 1 every day until finished. 05/27/21   Tomi Bamberger, PA-C  ciprofloxacin (CIPRO) 500 MG tablet Take 1 tablet (500 mg total) by mouth every 12 (twelve) hours for 5 days 05/16/22     ibuprofen (ADVIL,MOTRIN) 600 MG tablet Take 1 tablet (600 mg total) by mouth every 6 (six) hours as needed. 05/26/18   Domenick Gong, MD  linaclotide [facility]) 72 MCG capsule Take 1 capsule (72 mcg total) by mouth daily at least 30 minutes before the first meal of the day on an empty stomach 09/24/22     linaclotide (LINZESS) 72  MCG capsule Take 1 capsule by mouth at least 30 minutes before the first meal of the day on an empty stomach 12/03/22     lubiprostone (AMITIZA) 24 MCG capsule Take 1 capsule by mouth with food and water twice daily 01/15/23     nitrofurantoin, macrocrystal-monohydrate, (MACROBID) 100 MG capsule Take 1 capsule (100 mg total) by mouth 2 (two) times daily. 04/25/23   Marcene Corning, MD  ondansetron (ZOFRAN ODT) 4 MG disintegrating tablet Take 1 tablet (4 mg total) by mouth every 8 (eight) hours as needed for nausea or vomiting. 09/12/20   Wieters, Hallie C, PA-C  ondansetron (ZOFRAN) 4 MG tablet Take 1 tablet (4 mg total) by mouth every 8 (eight) hours as needed. 12/03/22     pantoprazole (PROTONIX) 40 MG tablet Take 1 tablet (40 mg total) by mouth daily. 12/03/22     pantoprazole (PROTONIX) 40 MG tablet Take 1 tablet (40 mg total) by mouth daily. 01/15/23     Semaglutide-Weight Management (WEGOVY) 0.5 MG/0.5ML SOAJ Inject 0.5 mg into the skin once a week. 09/10/22     Semaglutide-Weight Management (WEGOVY) 1 MG/0.5ML SOAJ Inject 1 mg under the skin once a week 01/15/23     omeprazole (PRILOSEC) 40 MG capsule Take 1 capsule (40 mg total) by mouth 2 (two) times daily. 01/26/15 09/12/20  Meryl Dare, MD      Allergies    Patient has no known allergies.  Review of Systems   Review of Systems  Cardiovascular:  Positive for chest pain.    Physical Exam Updated Vital Signs BP (!) 154/77   Pulse 65   Temp 98 F (36.7 C)   Resp 16   SpO2 98%  Physical Exam  ED Results / Procedures / Treatments   Labs (all labs ordered are listed, but only abnormal results are displayed) Labs Reviewed  BASIC METABOLIC PANEL WITH GFR  CBC  TROPONIN I (HIGH SENSITIVITY)  TROPONIN I (HIGH SENSITIVITY)    EKG EKG Interpretation Date/Time:  Monday August 11 2023 20:10:19 EDT Ventricular Rate:  68 PR Interval:  156 QRS Duration:  70 QT Interval:  398 QTC Calculation: 423 R Axis:   5  Text  Interpretation: Sinus rhythm with Premature atrial complexes Cannot rule out Anterior infarct , age undetermined Confirmed by Gloris Manchester [phone]) on 08/11/2023 9:07:11 PM  Radiology DG Chest 2 View Result Date: 08/11/2023 CLINICAL DATA:  Chest pain. EXAM: CHEST - 2 VIEW COMPARISON:  None Available. FINDINGS: Heart size upper normal.The cardiomediastinal contours are normal. The lungs are clear. Pulmonary vasculature is normal. No consolidation, pleural effusion, or pneumothorax. No acute osseous abnormalities are seen. IMPRESSION: Borderline cardiomegaly. No acute pulmonary process. Electronically Signed   By: Narda Rutherford M.D.   On: 08/11/2023 22:46    Procedures Procedures  {Document cardiac monitor, telemetry assessment procedure when appropriate:1}  Medications Ordered in ED Medications - No data to display  ED Course/ Medical Decision Making/ A&P   {   Click here for ABCD2, HEART and other calculatorsREFRESH Note before signing :1}                              Medical Decision Making Amount and/or Complexity of Data Reviewed Labs: ordered. Radiology: ordered.   ***  {Document critical care time when appropriate:1} {Document review of labs and clinical decision tools ie heart score, Chads2Vasc2 etc:1}  {Document your independent review of radiology images, and any outside records:1} {Document your discussion with family members, caretakers, and with consultants:1} {Document social determinants of health affecting pt's care:1} {Document your decision making why or why not admission, treatments were needed:1} Final Clinical Impression(s) / ED Diagnoses Final diagnoses:  None    Rx / DC Orders ED Discharge Orders     None

## 2023-08-11 NOTE — ED Provider Notes (Signed)
 [place] EMERGENCY DEPARTMENT AT [facility] Provider Note   CSN: [id number] Arrival date & time: 08/11/23  1948     History  Chief Complaint  Patient presents with   Chest Pain    Tracy Sellers is a 58 y.o. female.  Patient presents to the emergency department complaining of chest pain.  Patient states that around 7 PM this evening she was driving home and had a sudden onset 10 out of 10 pain in the center of her chest radiating straight through to her back.  She stopped at a fire department and was noted to be significantly hypertensive with a systolic blood pressure of approximately 200.  She states she has no history of hypertension.  EMS transported the patient and administered 324 mg of aspirin during transport.  Currently pain is 2 out of 10 described as a pressure.  Blood pressure has normalized as of this time.  During the event she has denied any shortness of breath, abdominal pain, nausea, or any further radiation of symptoms.  Past medical history significant for GERD.  No history of hypertension, diabetes, hyperlipidemia, tobacco usage   Chest Pain      Home Medications Prior to Admission medications   Medication Sig Start Date End Date Taking? Authorizing Provider  azithromycin (ZITHROMAX) 250 MG tablet Take 1 tablet (250 mg total) by mouth daily. Take first 2 tablets together, then 1 every day until finished. 05/27/21   Tomi Bamberger, PA-C  ciprofloxacin (CIPRO) 500 MG tablet Take 1 tablet (500 mg total) by mouth every 12 (twelve) hours for 5 days 05/16/22     ibuprofen (ADVIL,MOTRIN) 600 MG tablet Take 1 tablet (600 mg total) by mouth every 6 (six) hours as needed. 05/26/18   Domenick Gong, MD  linaclotide [facility]) 72 MCG capsule Take 1 capsule (72 mcg total) by mouth daily at least 30 minutes before the first meal of the day on an empty stomach 09/24/22     linaclotide (LINZESS) 72 MCG capsule Take 1 capsule by mouth at least 30 minutes before the first  meal of the day on an empty stomach 12/03/22     lubiprostone (AMITIZA) 24 MCG capsule Take 1 capsule by mouth with food and water twice daily 01/15/23     nitrofurantoin, macrocrystal-monohydrate, (MACROBID) 100 MG capsule Take 1 capsule (100 mg total) by mouth 2 (two) times daily. 04/25/23   Marcene Corning, MD  ondansetron (ZOFRAN ODT) 4 MG disintegrating tablet Take 1 tablet (4 mg total) by mouth every 8 (eight) hours as needed for nausea or vomiting. 09/12/20   Wieters, Hallie C, PA-C  ondansetron (ZOFRAN) 4 MG tablet Take 1 tablet (4 mg total) by mouth every 8 (eight) hours as needed. 12/03/22     pantoprazole (PROTONIX) 40 MG tablet Take 1 tablet (40 mg total) by mouth daily. 12/03/22     pantoprazole (PROTONIX) 40 MG tablet Take 1 tablet (40 mg total) by mouth daily. 01/15/23     Semaglutide-Weight Management (WEGOVY) 0.5 MG/0.5ML SOAJ Inject 0.5 mg into the skin once a week. 09/10/22     Semaglutide-Weight Management (WEGOVY) 1 MG/0.5ML SOAJ Inject 1 mg under the skin once a week 01/15/23     omeprazole (PRILOSEC) 40 MG capsule Take 1 capsule (40 mg total) by mouth 2 (two) times daily. 01/26/15 09/12/20  Meryl Dare, MD      Allergies    Patient has no known allergies.    Review of Systems   Review of  Systems  Cardiovascular:  Positive for chest pain.    Physical Exam Updated Vital Signs BP 131/66   Pulse 70   Temp 98 F (36.7 C)   Resp (!) 21   SpO2 98%  Physical Exam Vitals and nursing note reviewed.  Constitutional:      General: She is not in acute distress.    Appearance: She is well-developed.  HENT:     Head: Normocephalic and atraumatic.  Eyes:     Conjunctiva/sclera: Conjunctivae normal.  Cardiovascular:     Rate and Rhythm: Normal rate and regular rhythm.  Pulmonary:     Effort: Pulmonary effort is normal. No respiratory distress.     Breath sounds: Normal breath sounds.  Chest:     Chest wall: No tenderness.  Abdominal:     Palpations: Abdomen is soft.      Tenderness: There is no abdominal tenderness.  Musculoskeletal:        General: No swelling.     Cervical back: Neck supple.     Right lower leg: No edema.     Left lower leg: No edema.  Skin:    General: Skin is warm and dry.     Capillary Refill: Capillary refill takes less than 2 seconds.  Neurological:     Mental Status: She is alert.  Psychiatric:        Mood and Affect: Mood normal.     ED Results / Procedures / Treatments   Labs (all labs ordered are listed, but only abnormal results are displayed) Labs Reviewed  BASIC METABOLIC PANEL WITH GFR  CBC  TROPONIN I (HIGH SENSITIVITY)  TROPONIN I (HIGH SENSITIVITY)    EKG EKG Interpretation Date/Time:  Monday August 11 2023 20:10:19 EDT Ventricular Rate:  68 PR Interval:  156 QRS Duration:  70 QT Interval:  398 QTC Calculation: 423 R Axis:   5  Text Interpretation: Sinus rhythm with Premature atrial complexes Cannot rule out Anterior infarct , age undetermined Confirmed by Gloris Manchester [phone]) on 08/11/2023 9:07:11 PM  Radiology CT Angio Chest/Abd/Pel for Dissection W and/or Wo Contrast Result Date: 08/12/2023 CLINICAL DATA:  Acute aortic syndrome (AAS) suspected chest pain. EXAM: CT ANGIOGRAPHY CHEST, ABDOMEN AND PELVIS TECHNIQUE: Non-contrast CT of the chest was initially obtained. Multidetector CT imaging through the chest, abdomen and pelvis was performed using the standard protocol during bolus administration of intravenous contrast. Multiplanar reconstructed images and MIPs were obtained and reviewed to evaluate the vascular anatomy. RADIATION DOSE REDUCTION: This exam was performed according to the departmental dose-optimization program which includes automated exposure control, adjustment of the mA and/or kV according to patient size and/or use of iterative reconstruction technique. CONTRAST:  OMNIPAQUE IOHEXOL 350 MG/ML SOLN COMPARISON:  None Available. FINDINGS: CTA CHEST FINDINGS Cardiovascular: Preferential  opacification of the thoracic aorta. No evidence of thoracic aortic aneurysm or dissection. Normal heart size. No significant pericardial effusion. Mild atherosclerotic plaque of the thoracic aorta. At least 2 vessel coronary artery calcifications. The main pulmonary artery is normal in caliber. No central or segmental pulmonary embolus. Limited evaluation of the subsegmental level. Mediastinum/Nodes: No enlarged mediastinal, hilar, or axillary lymph nodes. Thyroid gland, trachea, and esophagus demonstrate no significant findings. Lungs/Pleura: No focal consolidation. No pulmonary nodule. No pulmonary mass. No pleural effusion. No pneumothorax. Musculoskeletal: No chest wall abnormality. No suspicious lytic or blastic osseous lesions. No acute displaced fracture. Review of the MIP images confirms the above findings. CTA ABDOMEN AND PELVIS FINDINGS VASCULAR Aorta: Mild atherosclerotic plaque. Normal  caliber aorta without aneurysm, dissection, vasculitis or significant stenosis. Celiac: Patent without evidence of aneurysm, dissection, vasculitis or significant stenosis. SMA: Severe narrowing of the proximal superior mesenteric artery due to noncalcified plaque the caliber down to 2 mm. Opacification distally is noted with normal caliber. Patent without evidence of aneurysm, dissection, vasculitis or significant stenosis. Renals: Both renal arteries are patent without evidence of aneurysm, dissection, vasculitis, fibromuscular dysplasia or significant stenosis. IMA: Patent without evidence of aneurysm, dissection, vasculitis or significant stenosis. Inflow: Patent without evidence of aneurysm, dissection, vasculitis or significant stenosis. Veins: No obvious venous abnormality within the limitations of this arterial phase study. Review of the MIP images confirms the above findings. NON-VASCULAR Hepatobiliary: No focal liver abnormality. No gallstones, gallbladder wall thickening, or pericholecystic fluid. No biliary  dilatation. Pancreas: No focal lesion. Normal pancreatic contour. No surrounding inflammatory changes. No main pancreatic ductal dilatation. Spleen: Normal in size without focal abnormality. Adrenals/Urinary Tract: No adrenal nodule bilaterally. Bilateral kidneys enhance symmetrically. No hydronephrosis. No hydroureter. The urinary bladder is unremarkable. Stomach/Bowel: Stomach is within normal limits. No evidence of bowel wall thickening or dilatation. Colonic diverticulosis. Appendix appears normal. Lymphatic: No lymphadenopathy. Reproductive: Status post hysterectomy. No adnexal masses. Other: No intraperitoneal free fluid. No intraperitoneal free gas. No organized fluid collection. Musculoskeletal: No abdominal wall hernia or abnormality. No suspicious lytic or blastic osseous lesions. No acute displaced fracture. Grade 1 anterolisthesis of L4 on L5. Review of the MIP images confirms the above findings. IMPRESSION: 1. Severe narrowing of the proximal superior mesenteric artery due to noncalcified plaque the caliber down to 2 mm. 2. No acute thoracic or abdominal aorta abnormality. 3.  Aortic Atherosclerosis (ICD10-I70.0). 4. No central or segmental pulmonary embolus. 5. Colonic diverticulosis with no acute diverticulitis. Electronically Signed   By: Tish Frederickson M.D.   On: 08/12/2023 01:25   DG Chest 2 View Result Date: 08/11/2023 CLINICAL DATA:  Chest pain. EXAM: CHEST - 2 VIEW COMPARISON:  None Available. FINDINGS: Heart size upper normal.The cardiomediastinal contours are normal. The lungs are clear. Pulmonary vasculature is normal. No consolidation, pleural effusion, or pneumothorax. No acute osseous abnormalities are seen. IMPRESSION: Borderline cardiomegaly. No acute pulmonary process. Electronically Signed   By: Narda Rutherford M.D.   On: 08/11/2023 22:46    Procedures Procedures    Medications Ordered in ED Medications  iohexol (OMNIPAQUE) 350 MG/ML injection 100 mL (100 mLs Intravenous  Contrast Given 08/11/23 2353)    ED Course/ Medical Decision Making/ A&P                                 Medical Decision Making Amount and/or Complexity of Data Reviewed Labs: ordered. Radiology: ordered.  Risk Prescription drug management.   This patient presents to the ED for concern of chest pain, this involves an extensive number of treatment options, and is a complaint that carries with it a high risk of complications and morbidity.  The differential diagnosis includes dissection, PE, ACS, pneumonia, musculoskeletal pain, others   Co morbidities that complicate the patient evaluation  Obesity   Additional history obtained:  Additional history obtained from family at bedside and EMS   Lab Tests:  I Ordered, and personally interpreted labs.  The pertinent results include: Initial troponin 5, repeat 7, unremarkable BMP and CBC   Imaging Studies ordered:  I ordered imaging studies including chest x-ray, CT angio dissection study of chest abdomen pelvis I independently visualized and interpreted  imaging which showed borderline cardiomegaly on plain films. 1. Severe narrowing of the proximal superior mesenteric artery due  to noncalcified plaque the caliber down to 2 mm.  2. No acute thoracic or abdominal aorta abnormality.  3.  Aortic Atherosclerosis (ICD10-I70.0).  4. No central or segmental pulmonary embolus.  5. Colonic diverticulosis with no acute diverticulitis.   I agree with the radiologist interpretation   Cardiac Monitoring: / EKG:  The patient was maintained on a cardiac monitor.  I personally viewed and interpreted the cardiac monitored which showed an underlying rhythm of: Sinus rhythm   Test / Admission - Considered:  Patient with no signs of ACS with negative troponins x 2 and nonischemic EKG.  No pneumonia on chest x-ray.  No pulmonary embolism or dissection on imaging.  Chest pain has subsided to a 1 out of 10 in severity at this time.  Feel the  patient can follow-up as an outpatient with her primary care team at this time for further evaluation as needed. Patient does have severe narrowing of the proximal superior mesenteric artery due to a noncalcified plaque with caliber down to 2 mm.  She is denying any abdominal pains.  She has no abdominal tenderness.  She denies pain after eating.  At this time patient does appear to be stable for discharge with follow-up with vascular surgery for further evaluation of the narrowing of the SMA.         Final Clinical Impression(s) / ED Diagnoses Final diagnoses:  Chest pain, unspecified type  Atherosclerosis of superior mesenteric artery [facility])    Rx / DC Orders ED Discharge Orders     None         Pamala Duffel 08/12/23 0324    Tilden Fossa, MD 08/12/23 [phone]

## 2023-08-12 LAB — TROPONIN I (HIGH SENSITIVITY): Troponin I (High Sensitivity): 7 ng/L (ref <18)

## 2023-08-12 NOTE — Discharge Instructions (Signed)
 Your chest pain workup tonight was reassuring.  Incidentally we found that the superior mesenteric artery had severe narrowing due to a plaque with narrowing as severe as 2 mm in diameter.  Is important that you schedule a follow-up appointment with vascular surgery for further evaluation of this narrowing.  If you develop severe abdominal pain, severe nausea, vomiting, or other life-threatening symptoms, please return immediately to the emergency department.

## 2023-08-15 ENCOUNTER — Encounter (HOSPITAL_BASED_OUTPATIENT_CLINIC_OR_DEPARTMENT_OTHER): Payer: Self-pay

## 2023-08-19 ENCOUNTER — Ambulatory Visit: Attending: Physician Assistant | Admitting: Physician Assistant

## 2023-08-19 ENCOUNTER — Encounter: Payer: Self-pay | Admitting: Physician Assistant

## 2023-08-19 VITALS — BP 124/70 | HR 72 | Ht 63.0 in | Wt 253.0 lb

## 2023-08-19 DIAGNOSIS — R0609 Other forms of dyspnea: Secondary | ICD-10-CM | POA: Diagnosis not present

## 2023-08-19 DIAGNOSIS — K551 Chronic vascular disorders of intestine: Secondary | ICD-10-CM

## 2023-08-19 DIAGNOSIS — R079 Chest pain, unspecified: Secondary | ICD-10-CM | POA: Diagnosis not present

## 2023-08-19 MED ORDER — METOPROLOL TARTRATE 100 MG PO TABS: 100.0000 mg | ORAL_TABLET | Freq: Once | ORAL | 0 refills | Status: DC

## 2023-08-19 NOTE — Progress Notes (Unsigned)
 Cardiology Office Note:  .   Date:  08/20/2023  ID:  Tracy Sellers, DOB 09-May-1965, MRN [id number] PCP: Irven Coe, MD  [facility] Providers Cardiologist:  Thomasene Ripple, DO     History of Present Illness: .   Tracy Sellers is a 58 y.o. female with past medical history of GERD.  She has family history of CAD and heart failure.  Patient was last seen by Dr. Dietrich Pates on 11/15/2013.  At the time, she was having some shortness of breath climbing stairs.  Subsequent echocardiogram obtained on 11/25/2013 showed EF 55%, trivial MR.  Exercise tolerance test obtained in July 2015 showed good exercise tolerance, marked hypertensive response to exercise, significant lead artifact at peak exercise making it difficult to exclude ischemic changes, overall nondiagnostic treadmill stress test due to lead artifact.  Suspicion for coronary artery disease at the time was low, therefore no additional workup was recommended.  She presented to the emergency room on 08/11/2023 after started having 10 out of 10 pain in the center of her chest while driving home around 7 PM that evening.  She stopped by the fire department and was noted to be hypertensive with a systolic blood pressure of 200.  She has no prior history of hypertension.  She was given high-dose aspirin prior to transport to the ED.  After she arrived in the emergency room, chest discomfort came down to 2 out of 10 and blood pressure normalized without further intervention.  CBC and basic metabolic panel were normal.  Serial troponin 5--> 7.  Chest x-ray showed borderline cardiomegaly, no acute pulmonary process.  CTA of the chest abdomen pelvis showed severe narrowing of the proximal SMA due to noncalcified plaque, no acute thoracic and abdominal aorta abnormality, no central or segmental PE, colonic diverticulosis without evidence of diverticulitis.  Heart size was normal, no evidence of pericardial effusion.  At least two-vessel coronary artery  calcification.  Patient presents today for cardiology consultation.  Since her ED visit, she continued to have intermittent chest pressure sensation in the substernal area.  There is no obvious exacerbating or alleviating factors.  There is no direct correlation with the degree of physical exertion.  She does feel short of breath with exertion for the past several months, however she attributed it to weight gain.  Otherwise, she has no lower extremity edema, orthopnea or PND.  I recommend a coronary CT for her intermittent chest discomfort.  She will be given a single dose of 100 mg of metoprolol tartrate 2 hours prior to the coronary CT.  I will also obtain an echocardiogram for her dyspnea on exertion.  I plan to see the patient back in 6 weeks.  For long-term follow-up, today's DOD is Dr. Servando Salina who will become her primary cardiologist.  ROS:   Patient has intermittent chest pain and dyspnea on exertion for the past several months.  Studies Reviewed: Marland Kitchen   EKG Interpretation Date/Time:  Tuesday August 19 2023 08:42:39 EDT Ventricular Rate:  60 PR Interval:  162 QRS Duration:  76 QT Interval:  422 QTC Calculation: 422 R Axis:   -5  Text Interpretation: Normal sinus rhythm  No significant ST-T wave changes Confirmed by Azalee Course [phone]) on 08/19/2023 8:45:27 AM     Risk Assessment/Calculations:             Physical Exam:   VS:  BP 124/70 (BP Location: Right Arm, Patient Position: Sitting, Cuff Size: Large)   Pulse 72  Ht 5\' 3"  (1.6 m)   Wt 253 lb (114.8 kg)   SpO2 96%   BMI 44.82 kg/m    Wt Readings from Last 3 Encounters:  08/19/23 253 lb (114.8 kg)  01/26/15 249 lb 9.6 oz (113.2 kg)  10/04/14 244 lb (110.7 kg)    GEN: Well nourished, well developed in no acute distress NECK: No JVD; No carotid bruits CARDIAC: RRR, no murmurs, rubs, gallops RESPIRATORY:  Clear to auscultation without rales, wheezing or rhonchi  ABDOMEN: Soft, non-tender, non-distended EXTREMITIES:  No edema;  No deformity   ASSESSMENT AND PLAN: .    Chest discomfort: Recently seen in the emergency room, serial troponin was negative x 2.  CTA of the chest abdomen pelvis showed severe SMA stenosis, patient does not have any postprandial abdominal discomfort.  There was no PE.  I recommend a coronary CT to make sure she does not have significant coronary artery disease.  Dyspnea on exertion: Obtain echocardiogram  SMA stenosis: Seen on recent CT of abdomen pelvis, per patient, she has upcoming visit with vascular surgery service.  Patient does not have clear postprandial abdominal discomfort.        Dispo: Follow-up in 2 months  Signed, Djuana Littleton, [place]

## 2023-08-19 NOTE — Patient Instructions (Signed)
 Medication Instructions:  NO CHANGES *If you need a refill on your cardiac medications before your next appointment, please call your pharmacy*  Lab Work: NO LABS If you have labs (blood work) drawn today and your tests are completely normal, you will receive your results only by: MyChart Message (if you have MyChart) OR A paper copy in the mail If you have any lab test that is abnormal or we need to change your treatment, we will call you to review the results.  Testing/Procedures:   Your cardiac CT will be scheduled at one of the below locations:   [facility] [address] [place], [place] [address] [phone]  If scheduled at [facility], please arrive at the [facility] and Children's Entrance (Entrance C2) of [facility] 30 minutes prior to test start time. You can use the FREE valet parking offered at entrance C (encouraged to control the heart rate for the test)  Proceed to the [facility] Radiology Department (first floor) to check-in and test prep.  All radiology patients and guests should use entrance C2 at [facility], accessed from [facility], even though the hospital's physical address listed is [address].     An IV will be required for this test and Nitroglycerin will be given.   On the Night Before the Test: Be sure to Drink plenty of water. Do not consume any caffeinated/decaffeinated beverages or chocolate 12 hours prior to your test. Do not take any antihistamines 12 hours prior to your test.  On the Day of the Test: Drink plenty of water until 1 hour prior to the test. Do not eat any food 1 hour prior to test. You may take your regular medications prior to the test.  Take metoprolol (Lopressor) two hours prior to test. If you take Furosemide/Hydrochlorothiazide/Spironolactone/Chlorthalidone, please HOLD on the morning of the test. Patients who wear a continuous glucose monitor MUST remove the  device prior to scanning. FEMALES- please wear underwire-free bra if available, avoid dresses & tight clothing  TAKE ONE TIME DOSE OF METOPROLOL TARTRATE 100 MG 2 HOURS PRIOR TO PROCEURE  After the Test: Drink plenty of water. After receiving IV contrast, you may experience a mild flushed feeling. This is normal. On occasion, you may experience a mild rash up to 24 hours after the test. This is not dangerous. If this occurs, you can take Benadryl 25 mg, Zyrtec, Claritin, or Allegra and increase your fluid intake. (Patients taking Tikosyn should avoid Benadryl, and may take Zyrtec, Claritin, or Allegra) If you experience trouble breathing, this can be serious. If it is severe call 911 IMMEDIATELY. If it is mild, please call our office.  We will call to schedule your test 2-4 weeks out understanding that some insurance companies will need an authorization prior to the service being performed.   For more information and frequently asked questions, please visit our website : [email or web address]  For non-scheduling related questions, please contact the cardiac imaging nurse navigator should you have any questions/concerns: Cardiac Imaging Nurse Navigators Direct Office Dial: [phone]   For scheduling needs, including cancellations and rescheduling, please call [place], [phone].    [address] Your physician has requested that you have an echocardiogram. Echocardiography is a painless test that uses sound waves to create images of your heart. It provides your doctor with information about the size and shape of your heart and how well your heart's chambers and valves are working. This  procedure takes approximately one hour. There are no restrictions for this procedure. Please do NOT wear cologne, perfume, aftershave, or lotions (deodorant is allowed). Please arrive 15 minutes prior to your appointment time.  Please note: We ask at that you not bring  children with you during ultrasound (echo/ vascular) testing. Due to room size and safety concerns, children are not allowed in the ultrasound rooms during exams. Our front office staff cannot provide observation of children in our lobby area while testing is being conducted. An adult accompanying a patient to their appointment will only be allowed in the ultrasound room at the discretion of the ultrasound technician under special circumstances. We apologize for any inconvenience.   Follow-Up: At [facility], you and your health needs are our priority.  As part of our continuing mission to provide you with exceptional heart care, our providers are all part of one team.  This team includes your primary Cardiologist (physician) and Advanced Practice Providers or APPs (Physician Assistants and Nurse Practitioners) who all work together to provide you with the care you need, when you need it.  Your next appointment:   6 week(s)  Provider:   Ervin Heath, PA   Other Instructions   1st Floor: - Lobby - Registration  - Pharmacy  - Lab - Cafe  2nd Floor: - PV Lab - Diagnostic Testing (echo, CT, nuclear med)  3rd Floor: - Vacant  4th Floor: - TCTS (cardiothoracic surgery) - AFib Clinic - Structural Heart Clinic - Vascular Surgery  - Vascular Ultrasound  5th Floor: - HeartCare Cardiology (general and EP) - Clinical Pharmacy for coumadin, hypertension, lipid, weight-loss medications, and med management appointments    Valet parking services will be available as well.

## 2023-09-01 DIAGNOSIS — G4733 Obstructive sleep apnea (adult) (pediatric): Secondary | ICD-10-CM | POA: Diagnosis not present

## 2023-09-10 NOTE — Progress Notes (Signed)
 Patient ID: Tracy Sellers, female   DOB: [birth year], 58 y.o.   MRN: [id number]  Reason for Consult: New Patient (Initial Visit)   Referred by Kelsey Patricia, MD  Subjective:     HPI  Tracy Sellers is a 58 y.o. female presenting for evaluation of incidentally noted SMA stenosis that was found on a CTA in April when she was having an acute chest pain episode.  She denies any postprandial abdominal pain although does have some intermittent pains after some meals.  She does report she had some pain after a steak dinner earlier this week and then diarrhea which somewhat relieved her discomfort.  She denies unintentional weight loss or food fear.  She is a never smoker.  Past Medical History:  Diagnosis Date   GERD (gastroesophageal reflux disease)    Headache    Family History  Problem Relation Age of Onset   CAD Mother    Diabetes Mother    Heart failure Mother        CHF   Kidney disease Mother    Heart disease Father        MI   Colon polyps Father    Breast cancer Paternal Grandmother    Prostate cancer Paternal Uncle        great uncle   Diabetes Maternal Grandfather    Stroke Maternal Grandfather    Diabetes Maternal Grandmother    Heart failure Maternal Grandmother        CHF   Diabetes Maternal Uncle    Diabetes Maternal Aunt    Lung cancer Paternal Grandfather    Leukemia Maternal Uncle        Hairy Cell   Past Surgical History:  Procedure Laterality Date   TONSILLECTOMY AND ADENOIDECTOMY     VAGINAL HYSTERECTOMY  2004   with bladder tact and rectal prolapse repair    Short Social History:  Social History   Tobacco Use   Smoking status: Never    Passive exposure: Yes   Smokeless tobacco: Never  Substance Use Topics   Alcohol use: No    Alcohol/week: 0.0 standard drinks of alcohol    No Known Allergies  Current Outpatient Medications  Medication Sig Dispense Refill   azithromycin  (ZITHROMAX ) 250 MG tablet Take 1 tablet (250 mg total) by mouth  daily. Take first 2 tablets together, then 1 every day until finished. (Patient not taking: Reported on 09/12/2023) 6 tablet 0   ciprofloxacin  (CIPRO ) 500 MG tablet Take 1 tablet (500 mg total) by mouth every 12 (twelve) hours for 5 days (Patient not taking: Reported on 09/12/2023) 10 tablet 0   ibuprofen  (ADVIL ,MOTRIN ) 600 MG tablet Take 1 tablet (600 mg total) by mouth every 6 (six) hours as needed. (Patient not taking: Reported on 09/12/2023) 20 tablet 0   linaclotide  (LINZESS ) 72 MCG capsule Take 1 capsule (72 mcg total) by mouth daily at least 30 minutes before the first meal of the day on an empty stomach (Patient not taking: Reported on 09/12/2023) 90 capsule 0   linaclotide  (LINZESS ) 72 MCG capsule Take 1 capsule by mouth at least 30 minutes before the first meal of the day on an empty stomach (Patient not taking: Reported on 09/12/2023) 90 capsule 0   lubiprostone  (AMITIZA ) 24 MCG capsule Take 1 capsule by mouth with food and water twice daily (Patient not taking: Reported on 09/12/2023) 60 capsule 0   metoprolol  tartrate (LOPRESSOR ) 100 MG tablet Take 1 tablet (100 mg total) by mouth  once for 1 dose. TAKE 2 HOURS PRIOR TO PROCEDURE 1 tablet 0   nitrofurantoin , macrocrystal-monohydrate, (MACROBID ) 100 MG capsule Take 1 capsule (100 mg total) by mouth 2 (two) times daily. (Patient not taking: Reported on 09/12/2023) 14 capsule 0   ondansetron  (ZOFRAN  ODT) 4 MG disintegrating tablet Take 1 tablet (4 mg total) by mouth every 8 (eight) hours as needed for nausea or vomiting. (Patient not taking: Reported on 09/12/2023) 20 tablet 0   ondansetron  (ZOFRAN ) 4 MG tablet Take 1 tablet (4 mg total) by mouth every 8 (eight) hours as needed. (Patient not taking: Reported on 09/12/2023) 30 tablet 0   pantoprazole  (PROTONIX ) 40 MG tablet Take 1 tablet (40 mg total) by mouth daily. (Patient not taking: Reported on 09/12/2023) 30 tablet 1   pantoprazole  (PROTONIX ) 40 MG tablet Take 1 tablet (40 mg total) by mouth daily. (Patient not  taking: Reported on 09/12/2023) 30 tablet 1   Semaglutide -Weight Management (WEGOVY ) 0.5 MG/0.5ML SOAJ Inject 0.5 mg into the skin once a week. (Patient not taking: Reported on 09/12/2023) 2 mL 0   Semaglutide -Weight Management (WEGOVY ) 1 MG/0.5ML SOAJ Inject 1 mg under the skin once a week (Patient not taking: Reported on 09/12/2023) 2 mL 0   No current facility-administered medications for this visit.   Facility-Administered Medications Ordered in Other Visits  Medication Dose Route Frequency Provider Last Rate Last Admin   diltiazem (CARDIZEM) injection 10 mg  10 mg Intravenous Q5 min PRN Maudine Sos, MD       metoprolol  tartrate (LOPRESSOR ) injection 10 mg  10 mg Intravenous Once PRN Maudine Sos, MD        REVIEW OF SYSTEMS All other systems were reviewed and are negative     Objective:  Objective   Vitals:   09/12/23 0938  BP: 130/68  Pulse: (!) 47  Resp: 18  Temp: (!) 97.3 F (36.3 C)  TempSrc: Temporal  SpO2: 100%  Weight: 255 lb 12.8 oz (116 kg)  Height: 5\' 3"  (1.6 m)   Body mass index is 45.31 kg/m.  Physical Exam General: no acute distress Cardiac: hemodynamically stable Pulm: normal work of breathing Neuro: alert, no focal deficit Extremities: no edema, cyanosis or wounds Vascular:   Right: Palpable radial, DP  Left: Palpable radial, DP   Data: CTA independently reviewed Proximal SMA stenosis, about 50%.  Widely patent celiac and IMA.     Assessment/Plan:     Tracy Sellers is a 58 y.o. female with an incidentally noted asymptomatic SMA stenosis of about 50%.  I explained that I do not think the SMA stenosis is the cause of her acute chest pain episode or her intermittent abdominal pains and we discussed that there is no indication to treat asymptomatic stenosis of the mesenteric vessels. Chronic mesenteric ischemia typically develops more than 1 vessel and her celiac and IMA are widely patent.  I explained that this is a diagnosis of exclusion  and that if she was having significant abdominal symptoms she would need to go through a thorough workup with gastroenterology first prior to any vascular intervention.    We discussed that she is at minimal risk of progression given that she does not have significant risk factors. I did explain that I would like her to be on a baby aspirin daily. Given that she does have some intermittent abdominal pain with certain meals and diarrhea I placed a referral to gastroenterology.  Will plan to follow-up in 1 year with a mesenteric duplex for  surveillance at that time we will also obtain a carotid duplex for screening.     Philipp Brawn MD [facility]

## 2023-09-11 ENCOUNTER — Telehealth (HOSPITAL_COMMUNITY): Payer: Self-pay | Admitting: Emergency Medicine

## 2023-09-11 NOTE — Telephone Encounter (Signed)
 Reaching out to patient to offer assistance regarding upcoming cardiac imaging study; pt verbalizes understanding of appt date/time, parking situation and where to check in, pre-test NPO status and medications ordered, and verified current allergies; name and call back number provided for further questions should they arise Rockwell Alexandria RN Navigator Cardiac Imaging [facility] [phone] office [phone] cell

## 2023-09-12 ENCOUNTER — Encounter: Payer: Self-pay | Admitting: Vascular Surgery

## 2023-09-12 ENCOUNTER — Ambulatory Visit: Attending: Vascular Surgery | Admitting: Vascular Surgery

## 2023-09-12 ENCOUNTER — Ambulatory Visit (HOSPITAL_COMMUNITY)
Admission: RE | Admit: 2023-09-12 | Discharge: 2023-09-12 | Disposition: A | Discharge status: Home / Self Care | Source: Ambulatory Visit | Attending: Cardiology | Admitting: Cardiology

## 2023-09-12 VITALS — BP 130/68 | HR 47 | Temp 97.3°F | Resp 18 | Ht 63.0 in | Wt 255.8 lb

## 2023-09-12 DIAGNOSIS — K551 Chronic vascular disorders of intestine: Secondary | ICD-10-CM | POA: Diagnosis not present

## 2023-09-12 DIAGNOSIS — R079 Chest pain, unspecified: Secondary | ICD-10-CM | POA: Diagnosis not present

## 2023-09-12 DIAGNOSIS — I700 Atherosclerosis of aorta: Secondary | ICD-10-CM

## 2023-09-12 MED ORDER — METOPROLOL TARTRATE 5 MG/5ML IV SOLN: 10.0000 mg | Freq: Once | INTRAVENOUS | Status: DC | PRN

## 2023-09-12 MED ORDER — NITROGLYCERIN 0.4 MG SL SUBL
0.8000 mg | SUBLINGUAL_TABLET | Freq: Once | SUBLINGUAL | Status: AC
  Administered 2023-09-12: 0.8 mg via SUBLINGUAL

## 2023-09-12 MED ORDER — DILTIAZEM HCL 25 MG/5ML IV SOLN: 10.0000 mg | INTRAVENOUS | Status: DC | PRN

## 2023-09-12 MED ORDER — IOHEXOL 350 MG/ML SOLN
100.0000 mL | Freq: Once | INTRAVENOUS | Status: AC | PRN
  Administered 2023-09-12: 100 mL via INTRAVENOUS

## 2023-09-18 ENCOUNTER — Ambulatory Visit: Payer: Self-pay | Admitting: Physician Assistant

## 2023-09-18 ENCOUNTER — Other Ambulatory Visit: Payer: Self-pay

## 2023-09-18 DIAGNOSIS — Z79899 Other long term (current) drug therapy: Secondary | ICD-10-CM

## 2023-09-18 DIAGNOSIS — G4733 Obstructive sleep apnea (adult) (pediatric): Secondary | ICD-10-CM | POA: Diagnosis not present

## 2023-09-18 MED ORDER — ATORVASTATIN CALCIUM 20 MG PO TABS: 20.0000 mg | ORAL_TABLET | Freq: Every day | ORAL | 3 refills | Status: AC

## 2023-09-18 NOTE — Telephone Encounter (Addendum)
 Called patient regarding results and what pharmacy patient preferred prescription sent to,patient verbalized understanding, sent Lipitor 20 mg to [facility], order for upcoming labs already placed  ----- Message from Ervin Heath sent at 09/18/2023  8:11 AM EDT ----- Pending noncardiac portion to be read by radiologist. Cardiac portion has been read by our cardiologist. It showed elevated coronary artery calcium score when compare to someone with the same age and gender, this will place you at higher risk for cardiovascular disease in the future, however the degree of blockage is still currently minimal, no sign of significant blockage to explain chest pain. In order to decrease future cardiovascular risk, I would recommend very tight cholesterol control, new LDL (bad cholesterol) goal should be <70 instead of the normal <100 utilized by most of primary care provider. I recommend start on 20mg  lipitor and check fasting lipid test and liver function test in 2 month.

## 2023-09-23 ENCOUNTER — Ambulatory Visit (HOSPITAL_COMMUNITY)
Admission: RE | Admit: 2023-09-23 | Discharge: 2023-09-23 | Disposition: A | Discharge status: Home / Self Care | Source: Ambulatory Visit | Attending: Cardiology | Admitting: Cardiology

## 2023-09-23 DIAGNOSIS — R0609 Other forms of dyspnea: Secondary | ICD-10-CM | POA: Insufficient documentation

## 2023-09-23 LAB — ECHOCARDIOGRAM COMPLETE
Area-P 1/2: 3.54 cm2
S' Lateral: 3.3 cm

## 2023-09-23 MED ORDER — PERFLUTREN LIPID MICROSPHERE
1.0000 mL | INTRAVENOUS | Status: AC | PRN
  Administered 2023-09-23: 2 mL via INTRAVENOUS

## 2023-09-30 ENCOUNTER — Ambulatory Visit: Attending: Physician Assistant | Admitting: Physician Assistant

## 2023-09-30 ENCOUNTER — Encounter: Payer: Self-pay | Admitting: Physician Assistant

## 2023-09-30 VITALS — BP 136/80 | HR 68 | Ht 63.0 in | Wt 259.0 lb

## 2023-09-30 DIAGNOSIS — R0789 Other chest pain: Secondary | ICD-10-CM | POA: Diagnosis not present

## 2023-09-30 DIAGNOSIS — I2510 Atherosclerotic heart disease of native coronary artery without angina pectoris: Secondary | ICD-10-CM | POA: Diagnosis not present

## 2023-09-30 DIAGNOSIS — K219 Gastro-esophageal reflux disease without esophagitis: Secondary | ICD-10-CM | POA: Diagnosis not present

## 2023-09-30 DIAGNOSIS — Z1159 Encounter for screening for other viral diseases: Secondary | ICD-10-CM | POA: Diagnosis not present

## 2023-09-30 DIAGNOSIS — E785 Hyperlipidemia, unspecified: Secondary | ICD-10-CM

## 2023-09-30 DIAGNOSIS — R7303 Prediabetes: Secondary | ICD-10-CM | POA: Diagnosis not present

## 2023-09-30 NOTE — Progress Notes (Signed)
 Cardiology Office Note:  .   Date:  09/30/2023  ID:  Tracy Sellers, DOB Feb 19, 1966, MRN [id number] PCP: Benedetto Brady, MD   [facility] Providers Cardiologist:  Jerryl Morin, DO    History of Present Illness: .   Tracy Sellers is a 58 y.o. female with past medical history of GERD.  She has family history of CAD and heart failure.  Patient was last seen by Dr. Ola Berger on 11/15/2013.  At the time, she was having some shortness of breath climbing stairs.  Subsequent echocardiogram obtained on 11/25/2013 showed EF 55%, trivial MR.  Exercise tolerance test obtained in July 2015 showed good exercise tolerance, marked hypertensive response to exercise, significant lead artifact at peak exercise making it difficult to exclude ischemic changes, overall nondiagnostic treadmill stress test due to lead artifact.  Suspicion for coronary artery disease at the time was low, therefore no additional workup was recommended.  She presented to the emergency room on 08/11/2023 after started having 10 out of 10 pain in the center of her chest while driving home around 7 PM that evening.  She stopped by the fire department and was noted to be hypertensive with a systolic blood pressure of 200.  She has no prior history of hypertension.  She was given high-dose aspirin prior to transport to the ED.  After she arrived in the emergency room, chest discomfort came down to 2 out of 10 and blood pressure normalized without further intervention.  CBC and basic metabolic panel were normal.  Serial troponin 5--> 7.  Chest x-ray showed borderline cardiomegaly, no acute pulmonary process.  CTA of the chest abdomen pelvis showed severe narrowing of the proximal SMA due to noncalcified plaque, no acute thoracic and abdominal aorta abnormality, no central or segmental PE, colonic diverticulosis without evidence of diverticulitis.  Heart size was normal, no evidence of pericardial effusion.  At least two-vessel coronary artery  calcification.    I initially seen the patient on 08/19/2023 as a new patient during [facility], she was still having intermittent chest pressure sensation in the substernal area.  There was no obvious exacerbating or alleviating factors.  I recommended coronary CT and echocardiogram.  Dr. Emmette Harms became her primary cardiologist's DOD that day.  Coronary CT obtained on 09/12/2023 showed 25 to 49% disease in proximal and mid LAD, less than 25% disease in D1, less than 25% disease in proximal RCA, less than 25% disease in ramus intermedius.  Coronary calcium  score was 122 which placed the patient at 94th percentile for age and sex matched control.  There was no significant extracardiac finding.  Subsequent echocardiogram obtained on 09/23/2023 showed EF 50 to 55%, no regional wall motion abnormality, normal RV, mild MR, borderline dilated ascending aorta measuring 37 mm.  Patient presents today for follow-up, she continues to have occasional intermittent chest discomfort, however they do not occur with physical activity.  Initial blood pressure on arrival was 158/86, on manual recheck by myself, blood pressure was 136/80.  We reviewed her coronary CT and echocardiogram.  I recently started her on 20 mg daily of Lipitor.  She had fasting blood work this morning, I asked her to forward lab result to us .  If LDL is greater than 70, I likely will increase the Lipitor to 40 mg daily and recheck again in 2 months.  She can follow-up with Dr. Emmette Harms in 4 to 84-month.  ROS:   Patient complains of occasional chest discomfort or shortness of breath,  she has no lower extremity edema, orthopnea or PND.  Studies Reviewed: .        Cardiac Studies & Procedures   ______________________________________________________________________________________________     ECHOCARDIOGRAM  ECHOCARDIOGRAM COMPLETE 09/23/2023  Narrative ECHOCARDIOGRAM REPORT    Patient Name:   Tracy Sellers Date of Exam:  09/23/2023 Medical Rec #:  [id number]      Height:       63.0 in Accession #:    [id number]     Weight:       255.8 lb Date of Birth:  November 07, 1965      BSA:          2.147 m Patient Age:    58 years       BP:           130/68 mmHg Patient Gender: F              HR:           62 bpm. Exam Location:  [address]  Procedure: 2D Echo, Cardiac Doppler, Color Doppler and Intracardiac Opacification Agent (Both Spectral and Color Flow Doppler were utilized during procedure).  Indications:    R06.00 Dyspnea  History:        Patient has prior history of Echocardiogram examinations, most recent 11/25/2013.  Sonographer:    Brigid Canada RDCS Referring Phys: [id number] Jadene Stemmer  IMPRESSIONS   1. Left ventricular ejection fraction, by estimation, is 50 to 55%. The left ventricle has low normal function. The left ventricle has no regional wall motion abnormalities. There is mild asymmetric left ventricular hypertrophy of the inferior segment. Left ventricular diastolic parameters are indeterminate. 2. Right ventricular systolic function is normal. The right ventricular size is normal. Tricuspid regurgitation signal is inadequate for assessing PA pressure. 3. The mitral valve is normal in structure. Mild mitral valve regurgitation. No evidence of mitral stenosis. 4. The aortic valve has an indeterminant number of cusps. Aortic valve regurgitation is not visualized. Aortic valve sclerosis/calcification is present, without any evidence of aortic stenosis. 5. There is borderline dilatation of the ascending aorta, measuring 37 mm. 6. The inferior vena cava is normal in size with greater than 50% respiratory variability, suggesting right atrial pressure of 3 mmHg.  FINDINGS Left Ventricle: Left ventricular ejection fraction, by estimation, is 50 to 55%. The left ventricle has low normal function. The left ventricle has no regional wall motion abnormalities. Definity  contrast agent was given IV to  delineate the left ventricular endocardial borders. The left ventricular internal cavity size was normal in size. There is mild asymmetric left ventricular hypertrophy of the inferior segment. Left ventricular diastolic parameters are indeterminate.  Right Ventricle: The right ventricular size is normal. No increase in right ventricular wall thickness. Right ventricular systolic function is normal. Tricuspid regurgitation signal is inadequate for assessing PA pressure.  Left Atrium: Left atrial size was normal in size.  Right Atrium: Right atrial size was normal in size.  Pericardium: There is no evidence of pericardial effusion. Presence of epicardial fat layer.  Mitral Valve: The mitral valve is normal in structure. Mild to moderate mitral annular calcification. Mild mitral valve regurgitation. No evidence of mitral valve stenosis.  Tricuspid Valve: The tricuspid valve is normal in structure. Tricuspid valve regurgitation is not demonstrated. No evidence of tricuspid stenosis.  Aortic Valve: The aortic valve has an indeterminant number of cusps. Aortic valve regurgitation is not visualized. Aortic valve sclerosis/calcification is present, without any evidence of aortic stenosis.  Pulmonic Valve: The  pulmonic valve was normal in structure. Pulmonic valve regurgitation is not visualized. No evidence of pulmonic stenosis.  Aorta: There is borderline dilatation of the ascending aorta, measuring 37 mm.  Venous: The inferior vena cava is normal in size with greater than 50% respiratory variability, suggesting right atrial pressure of 3 mmHg.  IAS/Shunts: No atrial level shunt detected by color flow Doppler.   LEFT VENTRICLE PLAX 2D LVIDd:         5.00 cm   Diastology LVIDs:         3.30 cm   LV e' medial:    7.40 cm/s LV PW:         1.10 cm   LV E/e' medial:  12.5 LV IVS:        0.60 cm   LV e' lateral:   9.91 cm/s LVOT diam:     2.00 cm   LV E/e' lateral: 9.3 LV SV:         56 LV SV  Index:   26 LVOT Area:     3.14 cm   RIGHT VENTRICLE             IVC RV Basal diam:  4.10 cm     IVC diam: 1.70 cm RV S prime:     14.15 cm/s TAPSE (M-mode): 2.3 cm  LEFT ATRIUM             Index        RIGHT ATRIUM           Index LA diam:        4.60 cm 2.14 cm/m   RA Area:     14.20 cm LA Vol (A2C):   73.2 ml 34.10 ml/m  RA Volume:   37.00 ml  17.23 ml/m LA Vol (A4C):   49.0 ml 22.82 ml/m LA Biplane Vol: 61.6 ml 28.69 ml/m AORTIC VALVE LVOT Vmax:   72.60 cm/s LVOT Vmean:  49.800 cm/s LVOT VTI:    0.178 m  AORTA Ao Root diam: 3.00 cm Ao Asc diam:  3.70 cm  MITRAL VALVE MV Area (PHT): 3.54 cm    SHUNTS MV Decel Time: 215 msec    Systemic VTI:  0.18 m MV E velocity: 92.50 cm/s  Systemic Diam: 2.00 cm MV A velocity: 78.60 cm/s MV E/A ratio:  1.18  Kardie Tobb DO Electronically signed by Jerryl Morin DO Signature Date/Time: 09/23/2023/1:24:24 PM    Final      CT SCANS  CT CORONARY MORPH W/CTA COR W/SCORE 09/12/2023  Addendum 09/19/2023 11:36 PM ADDENDUM REPORT: 09/19/2023 23:33  EXAM: OVER-READ INTERPRETATION  CT CHEST  The following report is an over-read performed by radiologist Dr. Violeta Grey of [facility] on 09/19/2023. This over-read does not include interpretation of cardiac or coronary anatomy or pathology. The coronary calcium  score/coronary CTA interpretation by the cardiologist is attached.  COMPARISON:  None.  FINDINGS: Cardiovascular: There are no significant extracardiac vascular findings.  Mediastinum/Nodes: There are no enlarged lymph nodes within the visualized mediastinum.  Lungs/Pleura: There is no pleural effusion. The visualized lungs appear clear.  Upper abdomen: No significant findings in the visualized upper abdomen.  Musculoskeletal/Chest wall: No chest wall mass or suspicious osseous findings within the visualized chest.  IMPRESSION: No significant extracardiac findings within the visualized  chest.   Electronically Signed By: Violeta Grey M.D. On: 09/19/2023 23:33  Narrative CLINICAL DATA:  82F with CAD and chest pressure.  EXAM: Cardiac/Coronary  CT  TECHNIQUE: The patient was  scanned on a [employer]. Atretic [employer] [employer] just like a small-vessel CLL 9 and then close follow-up appointment will be  Ill-defined lucencies in the from the ER probably findings in the LAD thank you [employer] follow-up 09/17/2023 at the operative site  FINDINGS: A 120 kV prospective scan was triggered in the descending thoracic aorta at 111 HU's. Axial non-contrast 3 mm slices were carried out through the heart. The data set was analyzed on a dedicated work station and scored using the Agatson method. Gantry rotation speed was 250 msecs and collimation was .6 mm. No beta blockade and 0.8 mg of sl NTG was given. The 3D data set was reconstructed in 5% intervals of the 67-82 % of the R-R cycle. Diastolic phases were analyzed on a dedicated work station using MPR, MIP and VRT modes. The patient received 80 cc of contrast.  Aorta: Normal size.  Aortic atherosclerosis. No dissection.  Aortic Valve:  Trileaflet.  No calcifications.  Coronary Arteries:  Normal coronary origin.  Right dominance.  RCA is a large dominant artery that gives rise to PDA and PLVB. There is minimal (< 25%) calcified plaque proximally.  Left main is a large artery that gives rise to LAD, RI, and LCX arteries.  LAD is a large vessel that has mild (25-49%) mixed plaque in the proximal and mid vessel. D1 has minimal (< 25%) mixed plaque proximally. D2 is a small vessel without plaque.  RI is a large vessel with minimal (< 25%) plaque.  LCX is a non-dominant artery that gives rise to one large OM1 branch. There is no plaque.  Coronary Calcium  Score:  Left main: 0  Left anterior descending artery: 76.0  Left circumflex artery: 0  Right coronary artery: 45.8  Total: 122  Percentile:  94th  Other findings:  Normal pulmonary vein drainage into the left atrium.  Normal let atrial appendage without a thrombus.  Normal size of the pulmonary artery.  Non-cardiac: See separate report from [facility].  IMPRESSION: 1. Coronary calcium  score of 122. This was 94th percentile for age-, sex, and race-matched controls.  2. Total plaque volume 309 mm3 which is 82nd percentile for age- and sex-matched controls (calcified plaque 38 mm3; non-calcified plaque 271. TPV is moderate.  3. Normal coronary origin with right dominance.  4. There is mild (25-49%) plaque in the LAD and minimal (<25%) plaque in the RI and RCA. CAD-RADS 2.  5.  Aortic atherosclerosis.  RECOMMENDATIONS: 1. CAD-RADS 0: No evidence of CAD (0%). Consider non-atherosclerotic causes of chest pain.  2. CAD-RADS 1: Minimal non-obstructive CAD (0-24%). Consider non-atherosclerotic causes of chest pain. Consider preventive therapy and risk factor modification.  3. CAD-RADS 2: Mild non-obstructive CAD (25-49%). Consider non-atherosclerotic causes of chest pain. Consider preventive therapy and risk factor modification.  4. CAD-RADS 3: Moderate stenosis. Consider symptom-guided anti-ischemic pharmacotherapy as well as risk factor modification per guideline directed care. Additional analysis with CT FFR will be submitted.  5. CAD-RADS 4: Severe stenosis. (70-99% or > 50% left main). Cardiac catheterization or CT FFR is recommended. Consider symptom-guided anti-ischemic pharmacotherapy as well as risk factor modification per guideline directed care. Invasive coronary angiography recommended with revascularization per published guideline statements.  6. CAD-RADS 5: Total coronary occlusion (100%). Consider cardiac catheterization or viability assessment. Consider symptom-guided anti-ischemic pharmacotherapy as well as risk factor modification per guideline directed care.  7. CAD-RADS N:  Non-diagnostic study. Obstructive CAD can't be excluded. Alternative evaluation is recommended.  Maudine Sos, MD  Electronically  Signed: By: Maudine Sos M.D. On: 09/17/2023 16:18     ______________________________________________________________________________________________      Risk Assessment/Calculations:             Physical Exam:   VS:  BP 136/80   Pulse 68   Ht 5\' 3"  (1.6 m)   Wt 259 lb (117.5 kg)   SpO2 96%   BMI 45.88 kg/m    Wt Readings from Last 3 Encounters:  09/30/23 259 lb (117.5 kg)  09/12/23 255 lb 12.8 oz (116 kg)  08/19/23 253 lb (114.8 kg)    GEN: Well nourished, well developed in no acute distress NECK: No JVD; No carotid bruits CARDIAC: RRR, no murmurs, rubs, gallops RESPIRATORY:  Clear to auscultation without rales, wheezing or rhonchi  ABDOMEN: Soft, non-tender, non-distended EXTREMITIES:  No edema; No deformity   ASSESSMENT AND PLAN: .    Atypical chest pain: Recent coronary CT showed mild coronary artery disease, echocardiogram showed EF 50 to 55%, mild MR.  I added 20 mg daily of Lipitor to her medical regiment since her coronary calcium  score put her at 94th percentile for age and sex matched control.  She had fasting blood work at her PCPs office this morning, if LDL remains greater than 70, I likely will increase her Lipitor to 40 mg daily and recheck fasting lipid panel and LFTs in 2 months.  Her chest pain is atypical and it does not occur with physical activity.  Will continue monitoring.  She is aware to contact cardiology service if her symptoms become more associated with activity or worsening.  CAD: See #1.  Mild obstructive disease seen on coronary CTA  Hyperlipidemia: LDL goal less than 70.  On Lipitor 20 mg daily.  Blood work rechecked this morning, if LDL remains greater than 70, will increase Lipitor to 40 mg daily and recheck fasting lipid panel and LFT in 62-month.         Dispo: Follow-up with Dr. Emmette Harms in 4 to 6  months  Signed, Jasin Brazel, PA

## 2023-09-30 NOTE — Patient Instructions (Signed)
 Medication Instructions:  NO CHANGES *If you need a refill on your cardiac medications before your next appointment, please call your pharmacy*  Lab Work: NO LABS If you have labs (blood work) drawn today and your tests are completely normal, you will receive your results only by: MyChart Message (if you have MyChart) OR A paper copy in the mail If you have any lab test that is abnormal or we need to change your treatment, we will call you to review the results.  Testing/Procedures: NO TESTING  Follow-Up: At [facility], you and your health needs are our priority.  As part of our continuing mission to provide you with exceptional heart care, our providers are all part of one team.  This team includes your primary Cardiologist (physician) and Advanced Practice Providers or APPs (Physician Assistants and Nurse Practitioners) who all work together to provide you with the care you need, when you need it.  Your next appointment:   4-6 month(s)  Provider:   Kardie Tobb, DO

## 2023-10-03 ENCOUNTER — Ambulatory Visit: Admitting: Physician Assistant

## 2023-10-03 DIAGNOSIS — R7303 Prediabetes: Secondary | ICD-10-CM | POA: Diagnosis not present

## 2023-10-03 DIAGNOSIS — K219 Gastro-esophageal reflux disease without esophagitis: Secondary | ICD-10-CM | POA: Diagnosis not present

## 2023-10-03 DIAGNOSIS — Z23 Encounter for immunization: Secondary | ICD-10-CM | POA: Diagnosis not present

## 2023-10-03 DIAGNOSIS — Z0189 Encounter for other specified special examinations: Secondary | ICD-10-CM | POA: Diagnosis not present

## 2023-10-06 ENCOUNTER — Encounter: Payer: Self-pay | Admitting: Gastroenterology

## 2023-10-06 ENCOUNTER — Ambulatory Visit (INDEPENDENT_AMBULATORY_CARE_PROVIDER_SITE_OTHER): Admitting: Gastroenterology

## 2023-10-06 ENCOUNTER — Other Ambulatory Visit (HOSPITAL_COMMUNITY): Payer: Self-pay

## 2023-10-06 VITALS — BP 134/86 | HR 81 | Ht 63.25 in | Wt 258.2 lb

## 2023-10-06 DIAGNOSIS — R0789 Other chest pain: Secondary | ICD-10-CM

## 2023-10-06 DIAGNOSIS — R1310 Dysphagia, unspecified: Secondary | ICD-10-CM | POA: Diagnosis not present

## 2023-10-06 DIAGNOSIS — K2100 Gastro-esophageal reflux disease with esophagitis, without bleeding: Secondary | ICD-10-CM

## 2023-10-06 DIAGNOSIS — R1319 Other dysphagia: Secondary | ICD-10-CM

## 2023-10-06 DIAGNOSIS — R194 Change in bowel habit: Secondary | ICD-10-CM | POA: Diagnosis not present

## 2023-10-06 DIAGNOSIS — Z83719 Family history of colon polyps, unspecified: Secondary | ICD-10-CM

## 2023-10-06 MED ORDER — OMEPRAZOLE 40 MG PO CPDR
40.0000 mg | DELAYED_RELEASE_CAPSULE | Freq: Every day | ORAL | 3 refills | Status: DC
Start: 2023-10-06 — End: 2023-11-24
  Filled 2023-10-06: qty 90, 90d supply, fill #0

## 2023-10-06 MED ORDER — NA SULFATE-K SULFATE-MG SULF 17.5-3.13-1.6 GM/177ML PO SOLN
1.0000 | Freq: Once | ORAL | 0 refills | Status: AC
  Filled 2023-10-06: qty 354, 1d supply, fill #0

## 2023-10-06 MED ORDER — FAMOTIDINE 20 MG PO TABS
20.0000 mg | ORAL_TABLET | Freq: Two times a day (BID) | ORAL | 2 refills | Status: AC
Start: 2023-10-06 — End: ?
  Filled 2023-10-06: qty 30, 15d supply, fill #0
  Filled 2023-11-12: qty 30, 15d supply, fill #1
  Filled 2023-12-12: qty 30, 15d supply, fill #2

## 2023-10-06 NOTE — Progress Notes (Signed)
 I agree with the assessment and plan as outlined by Ms. May.

## 2023-10-06 NOTE — Patient Instructions (Addendum)
 Reflux disease Start Omeprazole  40 mg po daily 1 tablet 30-45 minutes before breakfast Start famotidine 20 mg po daily at bedtime GERD diet, no late meals 3-4 hours before lying down.  Elevate head of bed at bedtime  Constipation Recommend High fiber diet Drink plenty of water Start OTC Citrucel 1 tsp po daily Can add Miralax for constipation  You have been scheduled for an endoscopy and colonoscopy. Please follow the written instructions given to you at your visit today.  If you use inhalers (even only as needed), please bring them with you on the day of your procedure.  DO NOT TAKE 7 DAYS PRIOR TO TEST- Trulicity (dulaglutide) Ozempic , Wegovy  (semaglutide ) Mounjaro (tirzepatide) Bydureon Bcise (exanatide extended release)  DO NOT TAKE 1 DAY PRIOR TO YOUR TEST Rybelsus  (semaglutide ) Adlyxin (lixisenatide) Victoza (liraglutide) Byetta (exanatide) ___________________________________________________________________________ Due to recent changes in healthcare laws, you may see the results of your imaging and laboratory studies on MyChart before your provider has had a chance to review them.  We understand that in some cases there may be results that are confusing or concerning to you. Not all laboratory results come back in the same time frame and the provider may be waiting for multiple results in order to interpret others.  Please give us  48 hours in order for your provider to thoroughly review all the results before contacting the office for clarification of your results.   _______________________________________________________  If your blood pressure at your visit was 140/90 or greater, please contact your primary care physician to follow up on this.  _______________________________________________________  If you are age 57 or older, your body mass index should be between 23-30. Your Body mass index is 45.38 kg/m. If this is out of the aforementioned range listed, please  consider follow up with your Primary Care Provider.  If you are age 4 or younger, your body mass index should be between 19-25. Your Body mass index is 45.38 kg/m. If this is out of the aformentioned range listed, please consider follow up with your Primary Care Provider.   ________________________________________________________  The [place] [facility] providers would like to encourage you to use MYCHART to communicate with providers for non-urgent requests or questions.  Due to long hold times on the telephone, sending your provider a message by [facility] may be a faster and more efficient way to get a response.  Please allow 48 business hours for a response.  Please remember that this is for non-urgent requests.  _______________________________________________________ Thank you for trusting me with your gastrointestinal care. Deanna May, RNP

## 2023-10-06 NOTE — Progress Notes (Signed)
 Chief Complaint:stomach/chest pain Primary GI Doctor: (previously Dr. Sandrea Cruel) Dr. Rosaline Coma  HPI:  Patient is a 58 year old female patient with past medical history of GERD with esophagitis, high cholesterol, who was referred to me by Benedetto Brady, MD on 09/12/23 for a complaint of stomach/chest pain .   Last seen by Dr. Sandrea Cruel back in September 2016.   Pt presents to ED on 08/11/23 for chest pain. She stopped at a fire department and was noted to be significantly hypertensive with a systolic blood pressure of approximately 200. The pertinent results include: Initial troponin 5, repeat 7, unremarkable BMP and CBC.No pneumonia on chest x-ray. Patient does have severe narrowing of the proximal superior mesenteric artery due to a noncalcified plaque with caliber down to 2 mm. Referral to vascular surgery for evaluation of narrowing of SMA.  09/12/2023 seen by Vascular surgery for evaluation of incidentally noted SMA stenosis that was found on a CTA in April when she was having an acute chest pain episode.  I explained that I do not think the SMA stenosis is the cause of her acute chest pain episode or her intermittent abdominal pains and we discussed that there is no indication to treat asymptomatic stenosis of the mesenteric vessels. Start on baby ASA 81mg  po daily. Referral to GI.  09/30/23 last seen by cardiology and per note: "Coronary CT obtained on 09/12/2023 showed 25 to 49% disease in proximal and mid LAD, less than 25% disease in D1, less than 25% disease in proximal RCA, less than 25% disease in ramus intermedius. Coronary calcium  score was 122 which placed the patient at 94th percentile for age and sex matched control. There was no significant extracardiac finding. Subsequent echocardiogram obtained on 09/23/2023 showed EF 50 to 55%, no regional wall motion abnormality, normal RV, mild MR, borderline dilated ascending aorta measuring 37 mm. "  Interval History   Patient presents for evaluation chest  pain/tightness that sometimes radiates to center of her back. She states it would make it hard to breath. She states the episode she had back on April 7th she felt she was having heart attack. She has not had that same pain since. No known triggers. No food triggers.She was evaluated by cardiology as well as vascular then referred to GI for evaluation. She states she will have intermittent episodes where she feels food gets suck in middle of chest.  She also states if she eats too late and lies down it feels food is in back of her throat. She was on PPI therapy several years ago after EGD with Dr. Sandrea Cruel showed esophagitis, however she states after a couple of years she was doing well so she discontinued it. Patient taking OTC Advil  4 tablets about three times a week for HA's. She recently was started on baby ASA 81 mg po daily by Vascular doctor.     She states few months ago she was placed on Wegovy  which caused severe pyrosis and regurgitation, so they discontinued it. They have discussed restarting it or another GLP for weight loss, she has follow-up coming up.     She states she currently has regular bowel movement, but states she has history of irregular bowels. She states she tends to be constipated with intermittent diarrhea. No blood in stool.   Nonsmoker. No alcohol use.  Never had colonoscopy.   Patient's family history includes father with melanoma and colonoscopy , paternal grandmother with breast Ca, uncle with prostate CA, uncle with throat CA  Wt  Readings from Last 3 Encounters:  10/06/23 258 lb 3 oz (117.1 kg)  09/30/23 259 lb (117.5 kg)  09/12/23 255 lb 12.8 oz (116 kg)    Past Medical History:  Diagnosis Date   GERD (gastroesophageal reflux disease)    Headache     Past Surgical History:  Procedure Laterality Date   TONSILLECTOMY AND ADENOIDECTOMY     VAGINAL HYSTERECTOMY  2004   with bladder tact and rectal prolapse repair    Current Outpatient Medications  Medication  Sig Dispense Refill   aspirin 81 MG chewable tablet Chew 81 mg by mouth daily.     atorvastatin  (LIPITOR) 20 MG tablet Take 1 tablet (20 mg total) by mouth daily. 90 tablet 3   ibuprofen  (ADVIL ,MOTRIN ) 600 MG tablet Take 1 tablet (600 mg total) by mouth every 6 (six) hours as needed. 20 tablet 0   No current facility-administered medications for this visit.    Allergies as of 10/06/2023   (No Known Allergies)    Family History  Problem Relation Age of Onset   CAD Mother    Diabetes Mother    Heart failure Mother        CHF   Kidney disease Mother    Heart disease Father        MI   Colon polyps Father    Breast cancer Paternal Grandmother    Prostate cancer Paternal Uncle        great uncle   Diabetes Maternal Grandfather    Stroke Maternal Grandfather    Diabetes Maternal Grandmother    Heart failure Maternal Grandmother        CHF   Diabetes Maternal Uncle    Diabetes Maternal Aunt    Lung cancer Paternal Grandfather    Leukemia Maternal Uncle        Hairy Cell    Review of Systems:    Constitutional: No weight loss, fever, chills, weakness or fatigue HEENT: Eyes: No change in vision               Ears, Nose, Throat:  No change in hearing or congestion Skin: No rash or itching Cardiovascular: No chest pain, chest pressure or palpitations   Respiratory: No SOB or cough Gastrointestinal: See HPI and otherwise negative Genitourinary: No dysuria or change in urinary frequency Neurological: No headache, dizziness or syncope Musculoskeletal: No new muscle or joint pain Hematologic: No bleeding or bruising Psychiatric: No history of depression or anxiety    Physical Exam:  Vital signs: BP 134/86   Pulse 81   Ht 5' 3.25" (1.607 m)   Wt 258 lb 3 oz (117.1 kg)   BMI 45.38 kg/m   Constitutional:  Pleasant  female appears to be in NAD, Well developed, Well nourished, alert and cooperative Throat: Oral cavity and pharynx without inflammation, swelling or lesion.   Respiratory: Respirations even and unlabored. Lungs clear to auscultation bilaterally.   No wheezes, crackles, or rhonchi.  Cardiovascular: Normal S1, S2. Regular rate and rhythm. No peripheral edema, cyanosis or pallor.  Gastrointestinal:  Soft, nondistended, nontender. Obese. No rebound or guarding. Hypoactive bowel sounds. No appreciable masses or hepatomegaly. Rectal:  Not performed.  Msk:  Symmetrical without gross deformities. Without edema, no deformity or joint abnormality.  Neurologic:  Alert and  oriented x4;  grossly normal neurologically.  Skin:   Dry and intact without significant lesions or rashes. Psychiatric: Oriented to person, place and time. Demonstrates good judgement and reason without abnormal affect or behaviors.  RELEVANT LABS AND IMAGING: CBC    Latest Ref Rng & Units 08/11/2023    8:05 PM 09/27/2014    4:32 PM 11/25/2013    9:22 AM  CBC  WBC 4.0 - 10.5 K/uL 7.0  7.4  7.8   Hemoglobin 12.0 - 15.0 g/dL 16.1  09.6  04.5   Hematocrit 36.0 - 46.0 % 38.4  38.4  39.1   Platelets 150 - 400 K/uL 216  244.0  233.0      CMP     Latest Ref Rng & Units 08/11/2023    8:05 PM 09/27/2014    4:32 PM  CMP  Glucose 70 - 99 mg/dL 85  99   BUN 6 - 20 mg/dL 16  11   Creatinine 4.09 - 1.00 mg/dL 8.11  9.14   Sodium 782 - 145 mmol/L 136  138   Potassium 3.5 - 5.1 mmol/L 5.0  3.9   Chloride 98 - 111 mmol/L 99  104   CO2 22 - 32 mmol/L 26  29   Calcium  8.9 - 10.3 mg/dL 9.2  9.2   Total Protein 6.0 - 8.3 g/dL  7.3   Total Bilirubin 0.2 - 1.2 mg/dL  0.6   Alkaline Phos 39 - 117 U/L  76   AST 0 - 37 U/L  12   ALT 0 - 35 U/L  15      Lab Results  Component Value Date   TSH 1.48 09/27/2014   08/11/23 CT ANGIOGRAPHY CHEST, ABDOMEN AND PELVIS  IMPRESSION: 1. Severe narrowing of the proximal superior mesenteric artery due to noncalcified plaque the caliber down to 2 mm. 2. No acute thoracic or abdominal aorta abnormality. 3.  Aortic Atherosclerosis (ICD10-I70.0). 4. No central  or segmental pulmonary embolus. 5. Colonic diverticulosis with no acute diverticulitis. 09/23/23 echo- Left ventricular ejection fraction, by estimation, is 50 to 55%.  10/04/2014 EGD with Dr. Sandrea Cruel for chest pain, abdominal pain, and history of esophageal reflux ENDOSCOPIC IMPRESSION: 1. LA Class B esophagitis 2. Erythema found in the distal esophagus; multiple biopsies performed 3. Small hiatal hernia   Assessment: Encounter Diagnoses  Name Primary?   Esophageal dysphagia Yes   Atypical chest pain    Gastroesophageal reflux disease with esophagitis without hemorrhage    Altered bowel habits    Family history of colonic polyps     58 year old female patient with history of GERD complicated by esophagitis who presents with atypical chest pain and esophageal dysphagia. She has been seen and evaluated by both cardiology and vascular.  CT without any evidence of gallstones. Colonic diverticulosis without diverticulitis. Severe narrowing of the proximal superior mesenteric artery due to noncalcified plaque the caliber down to 2 mm. Started on baby ASA. We reviewed her EGD back in 2016 and she had also presented with chest pain. We discussed scheduling upper GI endoscopy to rule out stricture, right, or Barretts. Reinforced GERD diet, no late meals. Will start patient on Omeprazole  40mg  in the morning and famotidine at bedtime. She also takes NSAIDs several days a week which increases the risk of NSAID induced gastropathy. Recommended we complete our workup before she proceeds with starting GLP-1 so we can better manage her symptoms prior to starting.    Patient also has chronic constipation and we discussed high fiber diet and starting daily fiber supplementation. She can use OTC Miralax prn. Lastly, patient has never had colonoscopy and with family history of colonic poylps in first degree relative she is overdue. Will  schedule colonoscopy with 2 days prep in [facility] with Dr. Rosaline Coma.  Plan: -Start  Omeprazole  40 mg po daily 1 tablet 30-45 minutes before breakfast -Start famotidine 20 mg po daily at bedtime - Educated on GERD diet, no late meals 3-4 hours before lying down.  - Recommend High fiber diet -Drink plenty of fluids -Start OTC Citrucel 1 tsp po daily -Schedule EGD in [facility] with Dr. Rosaline Coma The risks and benefits of EGD with possible biopsies and esophageal dilation were discussed with the patient who agrees to proceed. -Schedule for a colonoscopy with 2 day prep in [facility] with Dr.Dorsey .The risks and benefits of colonoscopy with possible polypectomy / biopsies were discussed and the patient agrees to proceed.   Thank you for the courtesy of this consult. Please call me with any questions or concerns.   Lashan Macias, FNP-C [facility] 10/06/2023, 2:19 PM  Cc: Benedetto Brady, MD

## 2023-10-08 ENCOUNTER — Other Ambulatory Visit (HOSPITAL_COMMUNITY): Payer: Self-pay

## 2023-10-08 DIAGNOSIS — G4733 Obstructive sleep apnea (adult) (pediatric): Secondary | ICD-10-CM | POA: Diagnosis not present

## 2023-10-08 DIAGNOSIS — M25561 Pain in right knee: Secondary | ICD-10-CM | POA: Diagnosis not present

## 2023-10-08 MED ORDER — PREDNISONE 10 MG (21) PO TBPK
ORAL_TABLET | ORAL | 0 refills | Status: DC
  Filled 2023-10-08: qty 21, 6d supply, fill #0

## 2023-10-09 DIAGNOSIS — K219 Gastro-esophageal reflux disease without esophagitis: Secondary | ICD-10-CM | POA: Diagnosis not present

## 2023-10-09 DIAGNOSIS — G4733 Obstructive sleep apnea (adult) (pediatric): Secondary | ICD-10-CM | POA: Diagnosis not present

## 2023-10-09 DIAGNOSIS — R7303 Prediabetes: Secondary | ICD-10-CM | POA: Diagnosis not present

## 2023-10-24 DIAGNOSIS — M1711 Unilateral primary osteoarthritis, right knee: Secondary | ICD-10-CM | POA: Diagnosis not present

## 2023-11-07 DIAGNOSIS — G4733 Obstructive sleep apnea (adult) (pediatric): Secondary | ICD-10-CM | POA: Diagnosis not present

## 2023-11-11 DIAGNOSIS — G4733 Obstructive sleep apnea (adult) (pediatric): Secondary | ICD-10-CM | POA: Diagnosis not present

## 2023-11-12 ENCOUNTER — Other Ambulatory Visit (HOSPITAL_COMMUNITY): Payer: Self-pay

## 2023-11-14 ENCOUNTER — Ambulatory Visit: Admitting: Physician Assistant

## 2023-11-19 DIAGNOSIS — K219 Gastro-esophageal reflux disease without esophagitis: Secondary | ICD-10-CM | POA: Diagnosis not present

## 2023-11-19 DIAGNOSIS — G4733 Obstructive sleep apnea (adult) (pediatric): Secondary | ICD-10-CM | POA: Diagnosis not present

## 2023-11-19 DIAGNOSIS — E559 Vitamin D deficiency, unspecified: Secondary | ICD-10-CM | POA: Diagnosis not present

## 2023-11-19 DIAGNOSIS — E538 Deficiency of other specified B group vitamins: Secondary | ICD-10-CM | POA: Diagnosis not present

## 2023-11-19 DIAGNOSIS — Z6841 Body Mass Index (BMI) 40.0 and over, adult: Secondary | ICD-10-CM | POA: Diagnosis not present

## 2023-11-19 DIAGNOSIS — R7303 Prediabetes: Secondary | ICD-10-CM | POA: Diagnosis not present

## 2023-11-19 DIAGNOSIS — Z79899 Other long term (current) drug therapy: Secondary | ICD-10-CM | POA: Diagnosis not present

## 2023-11-20 ENCOUNTER — Other Ambulatory Visit (HOSPITAL_COMMUNITY): Payer: Self-pay

## 2023-11-20 ENCOUNTER — Other Ambulatory Visit (HOSPITAL_BASED_OUTPATIENT_CLINIC_OR_DEPARTMENT_OTHER): Payer: Self-pay

## 2023-11-20 MED ORDER — VITAMIN D3 1.25 MG (50000 UT) PO CAPS
50000.0000 [IU] | ORAL_CAPSULE | ORAL | 0 refills | Status: AC
  Filled 2023-11-20: qty 12, 84d supply, fill #0

## 2023-11-21 ENCOUNTER — Other Ambulatory Visit (HOSPITAL_COMMUNITY): Payer: Self-pay

## 2023-11-21 ENCOUNTER — Ambulatory Visit: Admitting: Gastroenterology

## 2023-11-21 DIAGNOSIS — Z1231 Encounter for screening mammogram for malignant neoplasm of breast: Secondary | ICD-10-CM | POA: Diagnosis not present

## 2023-11-21 DIAGNOSIS — Z01419 Encounter for gynecological examination (general) (routine) without abnormal findings: Secondary | ICD-10-CM | POA: Diagnosis not present

## 2023-11-21 DIAGNOSIS — R32 Unspecified urinary incontinence: Secondary | ICD-10-CM | POA: Diagnosis not present

## 2023-11-24 ENCOUNTER — Ambulatory Visit: Admitting: Internal Medicine

## 2023-11-24 ENCOUNTER — Encounter: Payer: Self-pay | Admitting: Internal Medicine

## 2023-11-24 DIAGNOSIS — K449 Diaphragmatic hernia without obstruction or gangrene: Secondary | ICD-10-CM

## 2023-11-24 DIAGNOSIS — K219 Gastro-esophageal reflux disease without esophagitis: Secondary | ICD-10-CM | POA: Diagnosis not present

## 2023-11-24 DIAGNOSIS — K3189 Other diseases of stomach and duodenum: Secondary | ICD-10-CM | POA: Diagnosis not present

## 2023-11-24 DIAGNOSIS — K648 Other hemorrhoids: Secondary | ICD-10-CM | POA: Diagnosis not present

## 2023-11-24 DIAGNOSIS — Z1211 Encounter for screening for malignant neoplasm of colon: Secondary | ICD-10-CM | POA: Diagnosis not present

## 2023-11-24 DIAGNOSIS — K5730 Diverticulosis of large intestine without perforation or abscess without bleeding: Secondary | ICD-10-CM | POA: Diagnosis not present

## 2023-11-24 DIAGNOSIS — R1310 Dysphagia, unspecified: Secondary | ICD-10-CM

## 2023-11-24 DIAGNOSIS — K2100 Gastro-esophageal reflux disease with esophagitis, without bleeding: Secondary | ICD-10-CM

## 2023-11-24 DIAGNOSIS — R0789 Other chest pain: Secondary | ICD-10-CM

## 2023-11-24 DIAGNOSIS — R1319 Other dysphagia: Secondary | ICD-10-CM

## 2023-11-24 DIAGNOSIS — K2289 Other specified disease of esophagus: Secondary | ICD-10-CM

## 2023-11-24 DIAGNOSIS — K319 Disease of stomach and duodenum, unspecified: Secondary | ICD-10-CM | POA: Diagnosis not present

## 2023-11-24 MED ORDER — OMEPRAZOLE 40 MG PO CPDR: 40.0000 mg | DELAYED_RELEASE_CAPSULE | Freq: Two times a day (BID) | ORAL | 3 refills | Status: DC

## 2023-11-24 MED ORDER — SODIUM CHLORIDE 0.9 % IV SOLN: 500.0000 mL | Freq: Once | INTRAVENOUS | Status: DC

## 2023-11-24 NOTE — Progress Notes (Signed)
 Called to room to assist during endoscopic procedure.  Patient ID and intended procedure confirmed with present staff. Received instructions for my participation in the procedure from the performing physician.

## 2023-11-24 NOTE — Patient Instructions (Addendum)
 Take Omeprazole  40 mg twice daily for 8 weeks and then decrease to once daily. Repeat Colonoscopy in 10 years for screening purposes. Follow up with [facility] at scheduled appointment.   YOU HAD AN ENDOSCOPIC PROCEDURE TODAY AT THE [facility]:   Refer to the procedure report that was given to you for any specific questions about what was found during the examination.  If the procedure report does not answer your questions, please call your gastroenterologist to clarify.  If you requested that your care partner not be given the details of your procedure findings, then the procedure report has been included in a sealed envelope for you to review at your convenience later.  YOU SHOULD EXPECT: Some feelings of bloating in the abdomen. Passage of more gas than usual.  Walking can help get rid of the air that was put into your GI tract during the procedure and reduce the bloating. If you had a lower endoscopy (such as a colonoscopy or flexible sigmoidoscopy) you may notice spotting of blood in your stool or on the toilet paper. If you underwent a bowel prep for your procedure, you may not have a normal bowel movement for a few days.  Please Note:  You might notice some irritation and congestion in your nose or some drainage.  This is from the oxygen used during your procedure.  There is no need for concern and it should clear up in a day or so.  SYMPTOMS TO REPORT IMMEDIATELY:  Following lower endoscopy (colonoscopy or flexible sigmoidoscopy):  Excessive amounts of blood in the stool  Significant tenderness or worsening of abdominal pains  Swelling of the abdomen that is new, acute  Fever of 100F or higher  Following upper endoscopy (EGD)  Vomiting of blood or coffee ground material  New chest pain or pain under the shoulder blades  Painful or persistently difficult swallowing  New shortness of breath  Fever of 100F or higher  Black, tarry-looking stools  For urgent or emergent  issues, a gastroenterologist can be reached at any hour by calling [phone]. Do not use MyChart messaging for urgent concerns.    DIET:  We do recommend a small meal at first, but then you may proceed to your regular diet.  Drink plenty of fluids but you should avoid alcoholic beverages for 24 hours.  ACTIVITY:  You should plan to take it easy for the rest of today and you should NOT DRIVE or use heavy machinery until tomorrow (because of the sedation medicines used during the test).    FOLLOW UP: Our staff will call the number listed on your records the next business day following your procedure.  We will call around 7:15- 8:00 am to check on you and address any questions or concerns that you may have regarding the information given to you following your procedure. If we do not reach you, we will leave a message.     If any biopsies were taken you will be contacted by phone or by letter within the next 1-3 weeks.  Please call us  at [phone] if you have not heard about the biopsies in 3 weeks.    SIGNATURES/CONFIDENTIALITY: You and/or your care partner have signed paperwork which will be entered into your electronic medical record.  These signatures attest to the fact that that the information above on your After Visit Summary has been reviewed and is understood.  Full responsibility of the confidentiality of this discharge information lies with you and/or  your care-partner.

## 2023-11-24 NOTE — Op Note (Signed)
 [facility] Patient Name: Tracy Sellers Procedure Date: 11/24/2023 7:22 AM MRN: [id number] Endoscopist: Rosario [name] , , [id number] Age: 58 Referring MD:  Date of Birth: 1966/04/12 Gender: Female Account #: [id number] Procedure:                Colonoscopy Indications:              Screening for colorectal malignant neoplasm, This                            is the patient's first colonoscopy Medicines:                Monitored Anesthesia Care Procedure:                Pre-Anesthesia Assessment:                           - Prior to the procedure, a History and Physical                            was performed, and patient medications and                            allergies were reviewed. The patient's tolerance of                            previous anesthesia was also reviewed. The risks                            and benefits of the procedure and the sedation                            options and risks were discussed with the patient.                            All questions were answered, and informed consent                            was obtained. Prior Anticoagulants: The patient has                            taken no anticoagulant or antiplatelet agents. ASA                            Grade Assessment: III - A patient with severe                            systemic disease. After reviewing the risks and                            benefits, the patient was deemed in satisfactory                            condition to undergo the procedure.  After obtaining informed consent, the colonoscope                            was passed under direct vision. Throughout the                            procedure, the patient's blood pressure, pulse, and                            oxygen saturations were monitored continuously. The                            Olympus Scope SN: [id number] was introduced through                            the anus and  advanced to the the terminal ileum.                            The colonoscopy was performed without difficulty.                            The patient tolerated the procedure well. The                            quality of the bowel preparation was adequate. The                            terminal ileum, ileocecal valve, appendiceal                            orifice, and rectum were photographed. Scope In: 8:27:43 AM Scope Out: 8:45:28 AM Scope Withdrawal Time: 0 hours 14 minutes 13 seconds  Total Procedure Duration: 0 hours 17 minutes 45 seconds  Findings:                 The terminal ileum appeared normal.                           Multiple diverticula were found in the sigmoid                            colon.                           Non-bleeding internal hemorrhoids were found during                            retroflexion. Complications:            No immediate complications. Estimated Blood Loss:     Estimated blood loss: none. Impression:               - The examined portion of the ileum was normal.                           - Diverticulosis in the sigmoid colon.                           -  Non-bleeding internal hemorrhoids.                           - No specimens collected. Recommendation:           - Discharge patient to home (with escort).                           - Repeat colonoscopy in 10 years for screening                            purposes.                           - The findings and recommendations were discussed                            with the patient. Dr Estefana Federico Rosario Estefana Federico,  11/24/2023 9:01:42 AM

## 2023-11-24 NOTE — Op Note (Signed)
 [facility] Patient Name: Tracy Sellers Procedure Date: 11/24/2023 7:23 AM MRN: [id number] Endoscopist: Rosario [name] , , [id number] Age: 58 Referring MD:  Date of Birth: 06-11-1965 Gender: Female Account #: [id number] Procedure:                Upper GI endoscopy Indications:              Dysphagia, Heartburn, Chest pain (non cardiac) Medicines:                Monitored Anesthesia Care Procedure:                Pre-Anesthesia Assessment:                           - Prior to the procedure, a History and Physical                            was performed, and patient medications and                            allergies were reviewed. The patient's tolerance of                            previous anesthesia was also reviewed. The risks                            and benefits of the procedure and the sedation                            options and risks were discussed with the patient.                            All questions were answered, and informed consent                            was obtained. Prior Anticoagulants: The patient has                            taken no anticoagulant or antiplatelet agents. ASA                            Grade Assessment: III - A patient with severe                            systemic disease. After reviewing the risks and                            benefits, the patient was deemed in satisfactory                            condition to undergo the procedure.                           After obtaining informed consent, the endoscope was  passed under direct vision. Throughout the                            procedure, the patient's blood pressure, pulse, and                            oxygen saturations were monitored continuously. The                            Olympus Scope [id number] was introduced through the                            mouth, and advanced to the second part of duodenum.                             The upper GI endoscopy was accomplished without                            difficulty. The patient tolerated the procedure                            well. Scope In: Scope Out: Findings:                 Salmon-colored mucosa was present. The maximum                            longitudinal extent of these esophageal mucosal                            changes was 1 cm in length. Mucosa was biopsied                            with a cold forceps for histology. One specimen                            bottle was sent to pathology.                           A guidewire was placed and the scope was withdrawn.                            Dilation was performed in the entire esophagus with                            a Savary dilator with mild resistance at 19 mm.                            Biopsies were taken with a cold forceps in the                            proximal esophagus and in the distal esophagus for  histology.                           A 3 cm hiatal hernia was present.                           Localized mildly erythematous mucosa without                            bleeding was found in the gastric antrum. Biopsies                            were taken with a cold forceps for histology.                           The examined duodenum was normal. Complications:            No immediate complications. Estimated Blood Loss:     Estimated blood loss was minimal. Impression:               - Salmon-colored mucosa. Biopsied.                           - 3 cm hiatal hernia.                           - Erythematous mucosa in the antrum. Biopsied.                           - Normal examined duodenum.                           - Dilation performed in the entire esophagus.                           - Biopsies were taken with a cold forceps for                            histology in the proximal esophagus and in the                            distal  esophagus. Recommendation:           - Await pathology results.                           - Trial of omeprazole  40 mg twice daily for 8                            weeks, then daily                           - Return to GI clinic in 2-3 months.                           - Perform a colonoscopy today. Dr Estefana Federico Rosario Estefana Federico,  11/24/2023 8:59:43 AM

## 2023-11-24 NOTE — Progress Notes (Signed)
 GASTROENTEROLOGY PROCEDURE H&P NOTE   Primary Care Physician: Leonel Cole, MD    Reason for Procedure:   Dysphagia, GERD, atypical chest pain, colon cancer screening  Plan:    EGD/colonoscopy  Patient is appropriate for endoscopic procedure(s) in the ambulatory ([facility]) setting.  The nature of the procedure, as well as the risks, benefits, and alternatives were carefully and thoroughly reviewed with the patient. Ample time for discussion and questions allowed. The patient understood, was satisfied, and agreed to proceed.     HPI: Tracy Sellers is a 58 y.o. female who presents for EGD/colonoscopy for evaluation of dysphagia, GERD, atypical chest pain, colon cancer screening.  Father had colon polyps. Patient was most recently seen in the Gastroenterology Clinic on 10/06/23.  No interval change in medical history since that appointment. Please refer to that note for full details regarding GI history and clinical presentation.   Past Medical History:  Diagnosis Date   Arthritis    GERD (gastroesophageal reflux disease)    Headache    Sleep apnea     Past Surgical History:  Procedure Laterality Date   TONSILLECTOMY AND ADENOIDECTOMY  1998   UPPER GASTROINTESTINAL ENDOSCOPY     VAGINAL HYSTERECTOMY  05/06/2002   with bladder tact and rectal prolapse repair    Prior to Admission medications   Medication Sig Start Date End Date Taking? Authorizing Provider  aspirin 81 MG chewable tablet Chew 81 mg by mouth daily.   Yes [provider]  atorvastatin  (LIPITOR) 20 MG tablet Take 1 tablet (20 mg total) by mouth daily. 09/18/23 12/17/23 Yes Meng, Hao, PA  Cholecalciferol  (VITAMIN D3) 1.25 MG (50000 UT) CAPS Take 1 capsule (50,000 Units total) by mouth once a week. 11/20/23  Yes   famotidine  (PEPCID ) 20 MG tablet Take 1 tablet (20 mg total) by mouth 2 (two) times daily. 10/06/23  Yes May, Deanna J, NP  Multiple Vitamins-Minerals (WOMENS 50+ MULTI VITAMIN/MIN) TABS Take by mouth.    Yes [provider]  omeprazole  (PRILOSEC) 40 MG capsule Take 1 capsule (40 mg total) by mouth daily. 10/06/23  Yes May, Deanna J, NP  ibuprofen  (ADVIL ,MOTRIN ) 600 MG tablet Take 1 tablet (600 mg total) by mouth every 6 (six) hours as needed. 05/26/18   Mortenson, Ashley, MD    Current Outpatient Medications  Medication Sig Dispense Refill   aspirin 81 MG chewable tablet Chew 81 mg by mouth daily.     atorvastatin  (LIPITOR) 20 MG tablet Take 1 tablet (20 mg total) by mouth daily. 90 tablet 3   Cholecalciferol  (VITAMIN D3) 1.25 MG (50000 UT) CAPS Take 1 capsule (50,000 Units total) by mouth once a week. 12 capsule 0   famotidine  (PEPCID ) 20 MG tablet Take 1 tablet (20 mg total) by mouth 2 (two) times daily. 30 tablet 2   Multiple Vitamins-Minerals (WOMENS 50+ MULTI VITAMIN/MIN) TABS Take by mouth.     omeprazole  (PRILOSEC) 40 MG capsule Take 1 capsule (40 mg total) by mouth daily. 90 capsule 3   ibuprofen  (ADVIL ,MOTRIN ) 600 MG tablet Take 1 tablet (600 mg total) by mouth every 6 (six) hours as needed. 20 tablet 0   Current Facility-Administered Medications  Medication Dose Route Frequency Provider Last Rate Last Admin   0.9 %  sodium chloride  infusion  500 mL Intravenous Once Frantz Quattrone C, MD        Allergies as of 11/24/2023   (No Known Allergies)    Family History  Problem Relation Age of Onset  CAD Mother    Diabetes Mother    Heart failure Mother        CHF   Kidney disease Mother    Heart disease Father        MI   Colon polyps Father    Diabetes Maternal Aunt    Diabetes Maternal Uncle    Leukemia Maternal Uncle        Hairy Cell   Prostate cancer Paternal Uncle        great uncle   Diabetes Maternal Grandmother    Heart failure Maternal Grandmother        CHF   Diabetes Maternal Grandfather    Stroke Maternal Grandfather    Breast cancer Paternal Grandmother    Lung cancer Paternal Grandfather    Colon cancer Neg Hx    Esophageal cancer Neg Hx    Rectal  cancer Neg Hx    Stomach cancer Neg Hx     Social History   Socioeconomic History   Marital status: Married    Spouse name: Not on file   Number of children: 3   Years of education: Not on file   Highest education level: Not on file  Occupational History   Occupation: CMA    Comment: [employer]  Tobacco Use   Smoking status: Never    Passive exposure: Yes   Smokeless tobacco: Never  Vaping Use   Vaping status: Never Used  Substance and Sexual Activity   Alcohol use: Never   Drug use: Never   Sexual activity: Not on file  Other Topics Concern   Not on file  Social History Narrative   Not on file   Social Drivers of Health   Financial Resource Strain: Not on file  Food Insecurity: Not on file  Transportation Needs: Not on file  Physical Activity: Not on file  Stress: Not on file  Social Connections: Not on file  Intimate Partner Violence: Not on file    Physical Exam: Vital signs in last 24 hours: BP (!) 159/87   Pulse (!) 58   Temp (!) 97.3 F (36.3 C) (Temporal)   Resp 14   Ht 5' 3.25 (1.607 m)   Wt 258 lb (117 kg)   SpO2 98%   BMI 45.34 kg/m  GEN: NAD EYE: Sclerae anicteric ENT: MMM CV: Non-tachycardic Pulm: No increased WOB GI: Soft NEURO:  Alert & Oriented   [name], MD [facility]   11/24/2023 8:06 AM

## 2023-11-24 NOTE — Progress Notes (Signed)
 Updated medical record.

## 2023-11-24 NOTE — Progress Notes (Signed)
To pacu, Vss.Report to Rn.tb

## 2023-11-25 ENCOUNTER — Telehealth: Payer: Self-pay

## 2023-11-25 NOTE — Telephone Encounter (Signed)
  Follow up Call-     11/24/2023    7:28 AM  Call back number  Post procedure Call Back phone  # [phone]  Permission to leave phone message Yes     Patient questions:  Do you have a fever, pain , or abdominal swelling? No. Pain Score  0 *  Have you tolerated food without any problems? Yes.    Have you been able to return to your normal activities? Yes.    Do you have any questions about your discharge instructions: Diet   No. Medications  No. Follow up visit  No.  Do you have questions or concerns about your Care? No.  Actions: * If pain score is 4 or above: No action needed, pain <4.

## 2023-11-26 LAB — SURGICAL PATHOLOGY

## 2023-11-27 ENCOUNTER — Ambulatory Visit: Payer: Self-pay | Admitting: Internal Medicine

## 2023-12-01 ENCOUNTER — Other Ambulatory Visit (HOSPITAL_COMMUNITY): Payer: Self-pay

## 2023-12-01 MED ORDER — ZEPBOUND 2.5 MG/0.5ML ~~LOC~~ SOAJ
2.5000 mg | SUBCUTANEOUS | 0 refills | Status: AC
  Filled 2023-12-01: qty 2, 28d supply, fill #0

## 2023-12-05 ENCOUNTER — Ambulatory Visit: Admitting: Cardiology

## 2023-12-08 DIAGNOSIS — G4733 Obstructive sleep apnea (adult) (pediatric): Secondary | ICD-10-CM | POA: Diagnosis not present

## 2023-12-12 ENCOUNTER — Other Ambulatory Visit (HOSPITAL_COMMUNITY): Payer: Self-pay

## 2023-12-12 DIAGNOSIS — G4733 Obstructive sleep apnea (adult) (pediatric): Secondary | ICD-10-CM | POA: Diagnosis not present

## 2023-12-18 ENCOUNTER — Other Ambulatory Visit (HOSPITAL_COMMUNITY): Payer: Self-pay

## 2023-12-18 DIAGNOSIS — G4733 Obstructive sleep apnea (adult) (pediatric): Secondary | ICD-10-CM | POA: Diagnosis not present

## 2023-12-18 DIAGNOSIS — K219 Gastro-esophageal reflux disease without esophagitis: Secondary | ICD-10-CM | POA: Diagnosis not present

## 2023-12-18 DIAGNOSIS — R7303 Prediabetes: Secondary | ICD-10-CM | POA: Diagnosis not present

## 2023-12-18 MED ORDER — ZEPBOUND 5 MG/0.5ML ~~LOC~~ SOAJ
5.0000 mg | SUBCUTANEOUS | 0 refills | Status: DC
  Filled 2023-12-18 – 2023-12-26 (×3): qty 2, 28d supply, fill #0

## 2023-12-20 ENCOUNTER — Other Ambulatory Visit (HOSPITAL_COMMUNITY): Payer: Self-pay

## 2023-12-22 ENCOUNTER — Other Ambulatory Visit: Payer: Self-pay

## 2023-12-26 ENCOUNTER — Other Ambulatory Visit (HOSPITAL_COMMUNITY): Payer: Self-pay

## 2023-12-29 ENCOUNTER — Other Ambulatory Visit: Payer: Self-pay

## 2023-12-30 ENCOUNTER — Other Ambulatory Visit (HOSPITAL_COMMUNITY): Payer: Self-pay

## 2024-01-12 DIAGNOSIS — G4733 Obstructive sleep apnea (adult) (pediatric): Secondary | ICD-10-CM | POA: Diagnosis not present

## 2024-01-15 ENCOUNTER — Ambulatory Visit: Admitting: Internal Medicine

## 2024-01-29 ENCOUNTER — Other Ambulatory Visit (HOSPITAL_COMMUNITY): Payer: Self-pay

## 2024-01-29 MED ORDER — ZEPBOUND 5 MG/0.5ML ~~LOC~~ SOAJ
5.0000 mg | SUBCUTANEOUS | 0 refills | Status: DC
  Filled 2024-01-29: qty 2, 28d supply, fill #0

## 2024-02-03 DIAGNOSIS — K219 Gastro-esophageal reflux disease without esophagitis: Secondary | ICD-10-CM | POA: Diagnosis not present

## 2024-02-03 DIAGNOSIS — G4733 Obstructive sleep apnea (adult) (pediatric): Secondary | ICD-10-CM | POA: Diagnosis not present

## 2024-02-03 DIAGNOSIS — R7303 Prediabetes: Secondary | ICD-10-CM | POA: Diagnosis not present

## 2024-02-12 ENCOUNTER — Other Ambulatory Visit (HOSPITAL_COMMUNITY): Payer: Self-pay

## 2024-02-12 ENCOUNTER — Ambulatory Visit: Admitting: Gastroenterology

## 2024-02-12 ENCOUNTER — Encounter: Payer: Self-pay | Admitting: Gastroenterology

## 2024-02-12 VITALS — BP 122/76 | HR 87 | Ht 63.25 in | Wt 248.0 lb

## 2024-02-12 DIAGNOSIS — Z809 Family history of malignant neoplasm, unspecified: Secondary | ICD-10-CM

## 2024-02-12 DIAGNOSIS — Z79899 Other long term (current) drug therapy: Secondary | ICD-10-CM

## 2024-02-12 DIAGNOSIS — R1319 Other dysphagia: Secondary | ICD-10-CM | POA: Diagnosis not present

## 2024-02-12 DIAGNOSIS — K2100 Gastro-esophageal reflux disease with esophagitis, without bleeding: Secondary | ICD-10-CM | POA: Diagnosis not present

## 2024-02-12 DIAGNOSIS — R0789 Other chest pain: Secondary | ICD-10-CM | POA: Diagnosis not present

## 2024-02-12 DIAGNOSIS — Z7982 Long term (current) use of aspirin: Secondary | ICD-10-CM

## 2024-02-12 DIAGNOSIS — Z1211 Encounter for screening for malignant neoplasm of colon: Secondary | ICD-10-CM

## 2024-02-12 MED ORDER — OMEPRAZOLE 40 MG PO CPDR
40.0000 mg | DELAYED_RELEASE_CAPSULE | Freq: Every day | ORAL | 1 refills | Status: AC
  Filled 2024-02-12: qty 90, 90d supply, fill #0

## 2024-02-12 NOTE — Progress Notes (Signed)
 Chief Complaint:stomach/chest pain Primary GI Doctor: (previously Dr. Aneita) Dr. Federico  HPI:  Patient is a 58 year old female patient with past medical history of GERD with esophagitis, high cholesterol, who was referred to me by Tracy Cole, MD on 09/12/23 for a complaint of stomach/chest pain .   Last seen by Dr. Aneita back in September 2016.   Pt presents to ED on 08/11/23 for chest pain. She stopped at a fire department and was noted to be significantly hypertensive with a systolic blood pressure of approximately 200. The pertinent results include: Initial troponin 5, repeat 7, unremarkable BMP and CBC.No pneumonia on chest x-ray. Patient does have severe narrowing of the proximal superior mesenteric artery due to a noncalcified plaque with caliber down to 2 mm. Referral to vascular surgery for evaluation of narrowing of SMA.  09/12/2023 seen by Vascular surgery for evaluation of incidentally noted SMA stenosis that was found on a CTA in April when she was having an acute chest pain episode.  I explained that I do not think the SMA stenosis is the cause of her acute chest pain episode or her intermittent abdominal pains and we discussed that there is no indication to treat asymptomatic stenosis of the mesenteric vessels. Start on baby ASA 81mg  po daily. Referral to GI.  09/30/23 last seen by cardiology and per note: Coronary CT obtained on 09/12/2023 showed 25 to 49% disease in proximal and mid LAD, less than 25% disease in D1, less than 25% disease in proximal RCA, less than 25% disease in ramus intermedius. Coronary calcium  score was 122 which placed the patient at 94th percentile for age and sex matched control. There was no significant extracardiac finding. Subsequent echocardiogram obtained on 09/23/2023 showed EF 50 to 55%, no regional wall motion abnormality, normal RV, mild MR, borderline dilated ascending aorta measuring 37 mm.   Interval History    Patient presents for follow-up.  Patient  recently had EGD with dilatation which she states has helped with the chest discomfort.  Patient was also placed on 8 weeks of PPI therapy twice daily which has also helped.She recently was started on Zepbound  and thus far has done well on the medication. She has lost 10 lbs in 3 mths.   She reports the constipation has improved.  No other concerns at this time  Patient's family history includes father with melanoma and colonoscopy , paternal grandmother with breast Ca, uncle with prostate CA, uncle with throat CA  Wt Readings from Last 3 Encounters:  02/12/24 248 lb (112.5 kg)  11/24/23 258 lb (117 kg)  10/06/23 258 lb 3 oz (117.1 kg)    Past Medical History:  Diagnosis Date   Arthritis    GERD (gastroesophageal reflux disease)    Headache    Sleep apnea     Past Surgical History:  Procedure Laterality Date   TONSILLECTOMY AND ADENOIDECTOMY  1998   UPPER GASTROINTESTINAL ENDOSCOPY     VAGINAL HYSTERECTOMY  05/06/2002   with bladder tact and rectal prolapse repair    Current Outpatient Medications  Medication Sig Dispense Refill   aspirin 81 MG chewable tablet Chew 81 mg by mouth daily.     atorvastatin  (LIPITOR) 20 MG tablet Take 1 tablet (20 mg total) by mouth daily. 90 tablet 3   Cholecalciferol  (VITAMIN D3) 1.25 MG (50000 UT) CAPS Take 1 capsule (50,000 Units total) by mouth once a week. (Patient taking differently: Take 50,000 Units by mouth once a week. Pt finish taking this  vitamin d and was told to start taking and over the counter vitamin d) 12 capsule 0   Cyanocobalamin  (VITAMIN B12) 1000 MCG TABS 5 tablets Orally every 2 days (Patient taking differently: Pt taking 5000 every 2 days)     ibuprofen  (ADVIL ,MOTRIN ) 600 MG tablet Take 1 tablet (600 mg total) by mouth every 6 (six) hours as needed. 20 tablet 0   Multiple Vitamins-Minerals (WOMENS 50+ MULTI VITAMIN/MIN) TABS Take by mouth.     omeprazole  (PRILOSEC) 40 MG capsule Take 1 capsule (40 mg total) by mouth in the  morning and at bedtime. 90 capsule 3   tirzepatide  (ZEPBOUND ) 2.5 MG/0.5ML Pen Inject 2.5 mg into the skin once a week. 2 mL 0   tirzepatide  (ZEPBOUND ) 5 MG/0.5ML Pen Inject 5 mg into the skin once a week. 2 mL 0   famotidine  (PEPCID ) 20 MG tablet Take 1 tablet (20 mg total) by mouth 2 (two) times daily. (Patient not taking: Reported on 02/12/2024) 30 tablet 2   No current facility-administered medications for this visit.    Allergies as of 02/12/2024   (No Known Allergies)    Family History  Problem Relation Age of Onset   CAD Mother    Diabetes Mother    Heart failure Mother        CHF   Kidney disease Mother    Heart disease Father        MI   Colon polyps Father    Diabetes Maternal Aunt    Diabetes Maternal Uncle    Leukemia Maternal Uncle        Hairy Cell   Prostate cancer Paternal Uncle        great uncle   Diabetes Maternal Grandmother    Heart failure Maternal Grandmother        CHF   Diabetes Maternal Grandfather    Stroke Maternal Grandfather    Breast cancer Paternal Grandmother    Lung cancer Paternal Grandfather    Colon cancer Neg Hx    Esophageal cancer Neg Hx    Rectal cancer Neg Hx    Stomach cancer Neg Hx     Review of Systems:    Constitutional: No weight loss, fever, chills, weakness or fatigue HEENT: Eyes: No change in vision               Ears, Nose, Throat:  No change in hearing or congestion Skin: No rash or itching Cardiovascular: No chest pain, chest pressure or palpitations   Respiratory: No SOB or cough Gastrointestinal: See HPI and otherwise negative Genitourinary: No dysuria or change in urinary frequency Neurological: No headache, dizziness or syncope Musculoskeletal: No new muscle or joint pain Hematologic: No bleeding or bruising Psychiatric: No history of depression or anxiety    Physical Exam:  Vital signs: BP 122/76   Pulse 87   Ht 5' 3.25 (1.607 m)   Wt 248 lb (112.5 kg)   BMI 43.58 kg/m   Constitutional:   Pleasant  female appears to be in NAD, Well developed, Well nourished, alert and cooperative Throat: Oral cavity and pharynx without inflammation, swelling or lesion.  Respiratory: Respirations even and unlabored. Lungs clear to auscultation bilaterally.   No wheezes, crackles, or rhonchi.  Cardiovascular: Normal S1, S2. Regular rate and rhythm. No peripheral edema, cyanosis or pallor.  Gastrointestinal:  Soft, nondistended, nontender. Obese. No rebound or guarding. Hypoactive bowel sounds. No appreciable masses or hepatomegaly. Rectal:  Not performed.  Msk:  Symmetrical without gross deformities. Without  edema, no deformity or joint abnormality.  Neurologic:  Alert and  oriented x4;  grossly normal neurologically.  Skin:   Dry and intact without significant lesions or rashes. Psychiatric: Oriented to person, place and time. Demonstrates good judgement and reason without abnormal affect or behaviors.  RELEVANT LABS AND IMAGING: CBC    Latest Ref Rng & Units 08/11/2023    8:05 PM 09/27/2014    4:32 PM 11/25/2013    9:22 AM  CBC  WBC 4.0 - 10.5 K/uL 7.0  7.4  7.8   Hemoglobin 12.0 - 15.0 g/dL 87.5  87.0  86.8   Hematocrit 36.0 - 46.0 % 38.4  38.4  39.1   Platelets 150 - 400 K/uL 216  244.0  233.0      CMP     Latest Ref Rng & Units 08/11/2023    8:05 PM 09/27/2014    4:32 PM  CMP  Glucose 70 - 99 mg/dL 85  99   BUN 6 - 20 mg/dL 16  11   Creatinine 9.55 - 1.00 mg/dL 9.18  9.28   Sodium 864 - 145 mmol/L 136  138   Potassium 3.5 - 5.1 mmol/L 5.0  3.9   Chloride 98 - 111 mmol/L 99  104   CO2 22 - 32 mmol/L 26  29   Calcium  8.9 - 10.3 mg/dL 9.2  9.2   Total Protein 6.0 - 8.3 g/dL  7.3   Total Bilirubin 0.2 - 1.2 mg/dL  0.6   Alkaline Phos 39 - 117 U/L  76   AST 0 - 37 U/L  12   ALT 0 - 35 U/L  15      Lab Results  Component Value Date   TSH 1.48 09/27/2014   08/11/23 CT ANGIOGRAPHY CHEST, ABDOMEN AND PELVIS  IMPRESSION: 1. Severe narrowing of the proximal superior mesenteric artery  due to noncalcified plaque the caliber down to 2 mm. 2. No acute thoracic or abdominal aorta abnormality. 3.  Aortic Atherosclerosis (ICD10-I70.0). 4. No central or segmental pulmonary embolus. 5. Colonic diverticulosis with no acute diverticulitis. 09/23/23 echo- Left ventricular ejection fraction, by estimation, is 50 to 55%.   10/04/2014 EGD with Dr. Aneita for chest pain, abdominal pain, and history of esophageal reflux ENDOSCOPIC IMPRESSION: 1. LA Class B esophagitis 2. Erythema found in the distal esophagus; multiple biopsies performed 3. Small hiatal hernia  11/24/23 EGD - Salmon- colored mucosa. Biopsied. - 3 cm hiatal hernia. - Erythematous mucosa in the antrum. Biopsied. - Normal examined duodenum. - Dilation performed in the entire esophagus. - Biopsies were taken with a cold forceps for histology in the proximal esophagus and in the distal esophagus.  11/24/23 colonoscopy, recall 10 years - The examined portion of the ileum was normal. - Diverticulosis in the sigmoid colon. - Non- bleeding internal hemorrhoids. - No specimens collected.  Path:     1. Surgical [P], gastric body :      - MILD REACTIVE GASTROPATHY.      - NEGATIVE FOR H. PYLORI ON H&E STAIN      - NO INTESTINAL METAPLASIA, DYSPLASIA, OR MALIGNANCY.       2. Surgical [P], distal esophagus :      - SQUAMOUS MUCOSA WITH MILD REFLUX CHANGES      - NEGATIVE FOR INCREASED INTRAEPITHELIAL EOSINOPHILS      - NEGATIVE FOR DYSPLASIA OR MALIGNANCY       3. Surgical [P], proximal esophagus :      - SQUAMOUS MUCOSA  WITH MILD REFLUX CHANGES      - NEGATIVE FOR INCREASED INTRAEPITHELIAL EOSINOPHILS      - NEGATIVE FOR DYSPLASIA OR MALIGNANCY     Assessment: Encounter Diagnoses  Name Primary?   Gastroesophageal reflux disease with esophagitis without hemorrhage Yes   Atypical chest pain    Esophageal dysphagia    Colon cancer screening   58 year old female patient that presents for follow-up on GERD and recent episode  of noncardiac chest pain with esophageal dysphagia.  Patient had upper GI endoscopy with dilatation with Dr. Federico and symptoms have improved.  Patient was also placed on 8 weeks of PPI therapy twice daily.  We discussed going down to once daily for now to see how she does on reduced dose. Patient also had colonoscopy which was normal with recall in 10 years.  Patient reports her constipation has improved with high-fiber diet and drinking plenty of fluid.  Plan: -Continue Omeprazole  40 mg po daily  - OTC famotidine  20 mg po daily at bedtime prn if needed - GERD diet, no late meals - Recommend High fiber diet -Drink plenty of fluids -Start OTC Citrucel 1 tsp po daily - recall colonoscopy 11/2033 -follow-up as needed  Thank you for the courtesy of this consult. Please call me with any questions or concerns.   Tracy Watson, FNP-C [facility] 02/12/2024, 3:24 PM  Cc: Tracy Cole, MD

## 2024-02-12 NOTE — Patient Instructions (Addendum)
 GERD Continue omeprazole  40 mg po daily Recommend GERD diet   Constipation Recommend high fiber diet Continue fiber supplementation  _______________________________________________________  If your blood pressure at your visit was 140/90 or greater, please contact your primary care physician to follow up on this.  _______________________________________________________  If you are age 57 or older, your body mass index should be between 23-30. Your Body mass index is 43.58 kg/m. If this is out of the aforementioned range listed, please consider follow up with your Primary Care Provider.  If you are age 82 or younger, your body mass index should be between 19-25. Your Body mass index is 43.58 kg/m. If this is out of the aformentioned range listed, please consider follow up with your Primary Care Provider.   ________________________________________________________  The [facility] providers would like to encourage you to use MYCHART to communicate with providers for non-urgent requests or questions.  Due to long hold times on the telephone, sending your provider a message by [facility] may be a faster and more efficient way to get a response.  Please allow 48 business hours for a response.  Please remember that this is for non-urgent requests.  _______________________________________________________  [facility] is using a team-based approach to care.  Your team is made up of your doctor and two to three APPS. Our APPS (Nurse Practitioners and Physician Assistants) work with your physician to ensure care continuity for you. They are fully qualified to address your health concerns and develop a treatment plan. They communicate directly with your gastroenterologist to care for you. Seeing the Advanced Practice Practitioners on your physician's team can help you by facilitating care more promptly, often allowing for earlier appointments, access to diagnostic testing, procedures, and  other specialty referrals.   Thank you for trusting me with your gastrointestinal care. Deanna May, FNP-C

## 2024-02-25 ENCOUNTER — Other Ambulatory Visit (HOSPITAL_COMMUNITY): Payer: Self-pay

## 2024-02-25 MED ORDER — TIRZEPATIDE-WEIGHT MANAGEMENT 5 MG/0.5ML ~~LOC~~ SOAJ
5.0000 mg | SUBCUTANEOUS | 0 refills | Status: AC
  Filled 2024-02-25: qty 2, 28d supply, fill #0

## 2024-02-27 DIAGNOSIS — M25571 Pain in right ankle and joints of right foot: Secondary | ICD-10-CM | POA: Diagnosis not present

## 2024-02-27 DIAGNOSIS — M79671 Pain in right foot: Secondary | ICD-10-CM | POA: Diagnosis not present

## 2024-02-28 ENCOUNTER — Other Ambulatory Visit (HOSPITAL_COMMUNITY): Payer: Self-pay

## 2024-03-09 ENCOUNTER — Other Ambulatory Visit (HOSPITAL_COMMUNITY): Payer: Self-pay

## 2024-03-09 DIAGNOSIS — K219 Gastro-esophageal reflux disease without esophagitis: Secondary | ICD-10-CM | POA: Diagnosis not present

## 2024-03-09 DIAGNOSIS — G4733 Obstructive sleep apnea (adult) (pediatric): Secondary | ICD-10-CM | POA: Diagnosis not present

## 2024-03-09 DIAGNOSIS — R7303 Prediabetes: Secondary | ICD-10-CM | POA: Diagnosis not present

## 2024-03-09 MED ORDER — LUBIPROSTONE 24 MCG PO CAPS
24.0000 ug | ORAL_CAPSULE | Freq: Two times a day (BID) | ORAL | 1 refills | Status: DC
  Filled 2024-03-09: qty 60, 30d supply, fill #0
  Filled 2024-04-16: qty 60, 30d supply, fill #1

## 2024-03-09 MED ORDER — ZEPBOUND 7.5 MG/0.5ML ~~LOC~~ SOAJ
7.5000 mg | SUBCUTANEOUS | 1 refills | Status: AC
  Filled 2024-03-09: qty 2, 30d supply, fill #0
  Filled 2024-03-24: qty 2, 28d supply, fill #0
  Filled 2024-04-16: qty 2, 28d supply, fill #1

## 2024-03-10 ENCOUNTER — Other Ambulatory Visit (HOSPITAL_COMMUNITY): Payer: Self-pay

## 2024-03-16 ENCOUNTER — Other Ambulatory Visit (HOSPITAL_COMMUNITY): Payer: Self-pay

## 2024-03-24 ENCOUNTER — Other Ambulatory Visit: Payer: Self-pay

## 2024-03-24 ENCOUNTER — Other Ambulatory Visit (HOSPITAL_COMMUNITY): Payer: Self-pay

## 2024-04-14 ENCOUNTER — Encounter: Payer: Self-pay | Admitting: *Deleted

## 2024-04-16 ENCOUNTER — Other Ambulatory Visit (HOSPITAL_COMMUNITY): Payer: Self-pay

## 2024-04-16 ENCOUNTER — Other Ambulatory Visit: Payer: Self-pay

## 2024-04-16 ENCOUNTER — Ambulatory Visit: Attending: Cardiology | Admitting: Cardiology

## 2024-04-16 ENCOUNTER — Encounter: Payer: Self-pay | Admitting: Cardiology

## 2024-04-16 VITALS — BP 128/68 | HR 72 | Ht 63.0 in | Wt 237.8 lb

## 2024-04-16 DIAGNOSIS — E785 Hyperlipidemia, unspecified: Secondary | ICD-10-CM

## 2024-04-16 DIAGNOSIS — I340 Nonrheumatic mitral (valve) insufficiency: Secondary | ICD-10-CM | POA: Diagnosis not present

## 2024-04-16 DIAGNOSIS — I10 Essential (primary) hypertension: Secondary | ICD-10-CM

## 2024-04-16 DIAGNOSIS — I2510 Atherosclerotic heart disease of native coronary artery without angina pectoris: Secondary | ICD-10-CM | POA: Diagnosis not present

## 2024-04-16 NOTE — Patient Instructions (Signed)

## 2024-04-20 NOTE — Progress Notes (Signed)
 Cardiology Office Note:    Date:  04/20/2024   ID:  Tracy Sellers, DOB Jul 23, 1965, MRN [id number]  PCP:  Leonel Cole, MD  Cardiologist:  Ezella Kell, DO  Electrophysiologist:  None   Referring MD: Leonel Cole, MD    I am doing well  History of Present Illness:    Tracy Sellers is a 58 y.o. female with a hx of GERD, Mild CAD, Hyperlipidemia and Prediabetes.   She her here today for a follow up visit. She has been seen in our office but this is my first visit with her.  She has no specific complaints. She wants to review her recent echo and has question about the mild mitral regurgitation.  She has managed her prediabetes with a 27-pound weight loss through dietary changes. She denies swelling.  She plans a trip to the mountains with her grandchildren after Christmas, which reflects good activity tolerance.   Past Medical History:  Diagnosis Date   Arthritis    GERD (gastroesophageal reflux disease)    Headache    Sleep apnea     Past Surgical History:  Procedure Laterality Date   TONSILLECTOMY AND ADENOIDECTOMY  1998   UPPER GASTROINTESTINAL ENDOSCOPY     VAGINAL HYSTERECTOMY  05/06/2002   with bladder tact and rectal prolapse repair    Current Medications: Active Medications[1]   Allergies:   Patient has no known allergies.   Social History   Socioeconomic History   Marital status: Married    Spouse name: Not on file   Number of children: 3   Years of education: Not on file   Highest education level: Not on file  Occupational History   Occupation: CMA    Comment: [employer]  Tobacco Use   Smoking status: Never    Passive exposure: Yes   Smokeless tobacco: Never  Vaping Use   Vaping status: Never Used  Substance and Sexual Activity   Alcohol use: Never   Drug use: Never   Sexual activity: Not on file  Other Topics Concern   Not on file  Social History Narrative   Not on file   Social Drivers of Health   Tobacco Use: Medium Risk  (04/16/2024)   Patient History    Smoking Tobacco Use: Never    Smokeless Tobacco Use: Never    Passive Exposure: Yes  Financial Resource Strain: Not on file  Food Insecurity: Not on file  Transportation Needs: Not on file  Physical Activity: Not on file  Stress: Not on file  Social Connections: Not on file  Depression (EYV7-0): Not on file  Alcohol Screen: Not on file  Housing: Not on file  Utilities: Not on file  Health Literacy: Not on file     Family History: The patient's family history includes Breast cancer in her paternal grandmother; CAD in her mother; Colon polyps in her father; Diabetes in her maternal aunt, maternal grandfather, maternal grandmother, maternal uncle, and mother; Heart disease in her father; Heart failure in her maternal grandmother and mother; Kidney disease in her mother; Leukemia in her maternal uncle; Lung cancer in her paternal grandfather; Prostate cancer in her paternal uncle; Stroke in her maternal grandfather. There is no history of Colon cancer, Esophageal cancer, Rectal cancer, or Stomach cancer.  ROS:   Review of Systems  Constitution: Negative for decreased appetite, fever and weight gain.  HENT: Negative for congestion, ear discharge, hoarse voice and sore throat.   Eyes: Negative for discharge, redness, vision loss in  right eye and visual halos.  Cardiovascular: Negative for chest pain, dyspnea on exertion, leg swelling, orthopnea and palpitations.  Respiratory: Negative for cough, hemoptysis, shortness of breath and snoring.   Endocrine: Negative for heat intolerance and polyphagia.  Hematologic/Lymphatic: Negative for bleeding problem. Does not bruise/bleed easily.  Skin: Negative for flushing, nail changes, rash and suspicious lesions.  Musculoskeletal: Negative for arthritis, joint pain, muscle cramps, myalgias, neck pain and stiffness.  Gastrointestinal: Negative for abdominal pain, bowel incontinence, diarrhea and excessive appetite.   Genitourinary: Negative for decreased libido, genital sores and incomplete emptying.  Neurological: Negative for brief paralysis, focal weakness, headaches and loss of balance.  Psychiatric/Behavioral: Negative for altered mental status, depression and suicidal ideas.  Allergic/Immunologic: Negative for HIV exposure and persistent infections.    EKGs/Labs/Other Studies Reviewed:    The following studies were reviewed today:   EKG:  The ekg ordered today demonstrates   Recent Labs: 08/11/2023: BUN 16; Creatinine, Ser 0.81; Hemoglobin 12.4; Platelets 216; Potassium 5.0; Sodium 136  Recent Lipid Panel    Component Value Date/Time   CHOL 175 11/25/2013 0922   TRIG 121.0 11/25/2013 0922   HDL 38.40 (L) 11/25/2013 0922   CHOLHDL 5 11/25/2013 0922   VLDL 24.2 11/25/2013 0922   LDLCALC 112 (H) 11/25/2013 0922    Physical Exam:    VS:  BP 128/68 (BP Location: Right Arm, Patient Position: Sitting, Cuff Size: Large)   Pulse 72   Ht 5' 3 (1.6 m)   Wt 237 lb 12.8 oz (107.9 kg)   SpO2 99%   BMI 42.12 kg/m     Wt Readings from Last 3 Encounters:  04/16/24 237 lb 12.8 oz (107.9 kg)  02/12/24 248 lb (112.5 kg)  11/24/23 258 lb (117 kg)     GEN: Well nourished, well developed in no acute distress HEENT: Normal NECK: No JVD; No carotid bruits LYMPHATICS: No lymphadenopathy CARDIAC: S1S2 noted,RRR, no murmurs, rubs, gallops RESPIRATORY:  Clear to auscultation without rales, wheezing or rhonchi  ABDOMEN: Soft, non-tender, non-distended, +bowel sounds, no guarding. EXTREMITIES: No edema, No cyanosis, no clubbing MUSCULOSKELETAL:  No deformity  SKIN: Warm and dry NEUROLOGIC:  Alert and oriented x 3, non-focal PSYCHIATRIC:  Normal affect, good insight  ASSESSMENT:    1. Mild CAD   2. Hyperlipidemia LDL goal <70   3. Primary hypertension   4. Mild mitral regurgitation    PLAN:    Mild CAD - no anginal symptoms. LDL goal < 70.  Cont Aspirin and statin.   HLN - continue  current dose of statin.  Blood pressure is at target.  Mild MR - no signs of fluid overload  The patient is in agreement with the above plan. The patient left the office in stable condition.  The patient will follow up in   Medication Adjustments/Labs and Tests Ordered: Current medicines are reviewed at length with the patient today.  Concerns regarding medicines are outlined above.  No orders of the defined types were placed in this encounter.  No orders of the defined types were placed in this encounter.   Patient Instructions  Medication Instructions:  Your physician recommends that you continue on your current medications as directed. Please refer to the Current Medication list given to you today.  *If you need a refill on your cardiac medications before your next appointment, please call your pharmacy*   Follow-Up: At [facility], you and your health needs are our priority.  As part of our continuing mission to  provide you with exceptional heart care, our providers are all part of one team.  This team includes your primary Cardiologist (physician) and Advanced Practice Providers or APPs (Physician Assistants and Nurse Practitioners) who all work together to provide you with the care you need, when you need it.  Your next appointment:   1 year(s)  Provider:   Jayden Kratochvil, DO     Adopting a Healthy Lifestyle.  Know what a healthy weight is for you (roughly BMI <25) and aim to maintain this   Aim for 7+ servings of fruits and vegetables daily   65-80+ fluid ounces of water or unsweet tea for healthy kidneys   Limit to max 1 drink of alcohol per day; avoid smoking/tobacco   Limit animal fats in diet for cholesterol and heart health - choose grass fed whenever available   Avoid highly processed foods, and foods high in saturated/trans fats   Aim for low stress - take time to unwind and care for your mental health   Aim for 150 min of moderate intensity  exercise weekly for heart health, and weights twice weekly for bone health   Aim for 7-9 hours of sleep daily   When it comes to diets, agreement about the perfect plan isnt easy to find, even among the experts. Experts at the [facility] [employer] developed an idea known as the Healthy Eating Plate. Just imagine a plate divided into logical, healthy portions.   The emphasis is on diet quality:   Load up on vegetables and fruits - one-half of your plate: Aim for color and variety, and remember that potatoes dont count.   Go for whole grains - one-quarter of your plate: Whole wheat, barley, wheat berries, quinoa, oats, Zani rice, and foods made with them. If you want pasta, go with whole wheat pasta.   Protein power - one-quarter of your plate: Fish, chicken, beans, and nuts are all healthy, versatile protein sources. Limit red meat.   The diet, however, does go beyond the plate, offering a few other suggestions.   Use healthy plant oils, such as olive, canola, soy, corn, sunflower and peanut. Check the labels, and avoid partially hydrogenated oil, which have unhealthy trans fats.   If youre thirsty, drink water. Coffee and tea are good in moderation, but skip sugary drinks and limit milk and dairy products to one or two daily servings.   The type of carbohydrate in the diet is more important than the amount. Some sources of carbohydrates, such as vegetables, fruits, whole grains, and beans-are healthier than others.   Finally, stay active  Signed, Armiyah Capron, DO  04/20/2024 9:28 PM    [facility]    [1]  Current Meds  Medication Sig   aspirin 81 MG chewable tablet Chew 81 mg by mouth daily.   atorvastatin  (LIPITOR) 20 MG tablet Take 1 tablet (20 mg total) by mouth daily.   Cholecalciferol  (VITAMIN D3) 1.25 MG (50000 UT) CAPS Take 1 capsule (50,000 Units total) by mouth once a week. (Patient taking differently: Take 50,000 Units by mouth once  a week. Pt finish taking this vitamin d and was told to start taking and over the counter vitamin d)   Cyanocobalamin  (VITAMIN B12) 1000 MCG TABS 5 tablets Orally every 2 days (Patient taking differently: Pt taking 5000 every 2 days)   famotidine  (PEPCID ) 20 MG tablet Take 1 tablet (20 mg total) by mouth 2 (two) times daily.   ibuprofen  (ADVIL ,MOTRIN )  600 MG tablet Take 1 tablet (600 mg total) by mouth every 6 (six) hours as needed.   lubiprostone  (AMITIZA ) 24 MCG capsule Take 1 capsule (24 mcg total) by mouth 2 (two) times daily with food and water   Multiple Vitamins-Minerals (WOMENS 50+ MULTI VITAMIN/MIN) TABS Take by mouth.   omeprazole  (PRILOSEC) 40 MG capsule Take 1 capsule (40 mg total) by mouth daily.   tirzepatide  (ZEPBOUND ) 2.5 MG/0.5ML Pen Inject 2.5 mg into the skin once a week.   tirzepatide  (ZEPBOUND ) 5 MG/0.5ML Pen Inject 5 mg into the skin once a week.   tirzepatide  (ZEPBOUND ) 7.5 MG/0.5ML Pen Inject 7.5 mg into the skin once a week.

## 2024-05-04 ENCOUNTER — Other Ambulatory Visit (HOSPITAL_COMMUNITY): Payer: Self-pay

## 2024-05-04 ENCOUNTER — Other Ambulatory Visit: Payer: Self-pay

## 2024-05-04 MED ORDER — LUBIPROSTONE 24 MCG PO CAPS
24.0000 ug | ORAL_CAPSULE | Freq: Two times a day (BID) | ORAL | 1 refills | Status: AC
  Filled 2024-05-04: qty 60, 30d supply, fill #0

## 2024-05-04 MED ORDER — ZEPBOUND 10 MG/0.5ML ~~LOC~~ SOAJ
10.0000 mg | SUBCUTANEOUS | 1 refills | Status: AC
  Filled 2024-05-04 – 2024-05-25 (×5): qty 2, 28d supply, fill #0

## 2024-05-07 ENCOUNTER — Other Ambulatory Visit: Payer: Self-pay

## 2024-05-13 ENCOUNTER — Other Ambulatory Visit (HOSPITAL_COMMUNITY): Payer: Self-pay

## 2024-05-15 ENCOUNTER — Other Ambulatory Visit (HOSPITAL_COMMUNITY): Payer: Self-pay

## 2024-05-25 ENCOUNTER — Other Ambulatory Visit (HOSPITAL_COMMUNITY): Payer: Self-pay

## 2024-05-26 ENCOUNTER — Other Ambulatory Visit (HOSPITAL_COMMUNITY): Payer: Self-pay

## 2024-05-29 ENCOUNTER — Other Ambulatory Visit (HOSPITAL_COMMUNITY): Payer: Self-pay

## 2024-05-31 ENCOUNTER — Other Ambulatory Visit (HOSPITAL_COMMUNITY): Payer: Self-pay

## 2024-06-01 ENCOUNTER — Other Ambulatory Visit: Payer: Self-pay

## 2024-06-01 ENCOUNTER — Other Ambulatory Visit (HOSPITAL_COMMUNITY): Payer: Self-pay
